# Patient Record
Sex: Female | Born: 1970 | Race: Black or African American | Hispanic: No | Marital: Married | Smoking: Never smoker
Health system: Southern US, Community
[De-identification: ages and names within clinical notes are randomized; demographics above are authoritative.]

## PROBLEM LIST (undated history)

## (undated) DIAGNOSIS — J45909 Unspecified asthma, uncomplicated: Secondary | ICD-10-CM

## (undated) DIAGNOSIS — L509 Urticaria, unspecified: Secondary | ICD-10-CM

## (undated) DIAGNOSIS — G43909 Migraine, unspecified, not intractable, without status migrainosus: Secondary | ICD-10-CM

## (undated) DIAGNOSIS — Z803 Family history of malignant neoplasm of breast: Secondary | ICD-10-CM

## (undated) HISTORY — PX: BREAST CYST ASPIRATION: SHX578

## (undated) HISTORY — DX: Urticaria, unspecified: L50.9

## (undated) HISTORY — DX: Family history of malignant neoplasm of breast: Z80.3

## (undated) HISTORY — DX: Unspecified asthma, uncomplicated: J45.909

## (undated) HISTORY — DX: Migraine, unspecified, not intractable, without status migrainosus: G43.909

---

## 2004-10-13 ENCOUNTER — Other Ambulatory Visit: Admission: RE | Admit: 2004-10-13 | Discharge: 2004-10-13 | Payer: Self-pay | Admitting: Obstetrics and Gynecology

## 2004-10-24 ENCOUNTER — Ambulatory Visit (HOSPITAL_COMMUNITY): Admission: RE | Admit: 2004-10-24 | Discharge: 2004-10-24 | Payer: Self-pay | Admitting: Obstetrics and Gynecology

## 2005-01-05 ENCOUNTER — Encounter: Admission: RE | Admit: 2005-01-05 | Discharge: 2005-01-05 | Payer: Self-pay | Admitting: Internal Medicine

## 2005-11-17 ENCOUNTER — Emergency Department: Payer: Self-pay | Admitting: Emergency Medicine

## 2006-08-16 ENCOUNTER — Ambulatory Visit: Payer: Self-pay | Admitting: Internal Medicine

## 2007-10-09 ENCOUNTER — Ambulatory Visit: Payer: Self-pay | Admitting: Internal Medicine

## 2010-01-25 ENCOUNTER — Encounter: Admission: RE | Admit: 2010-01-25 | Discharge: 2010-04-25 | Payer: Self-pay | Admitting: Obstetrics and Gynecology

## 2010-11-09 ENCOUNTER — Ambulatory Visit: Payer: Self-pay

## 2011-02-03 ENCOUNTER — Ambulatory Visit: Payer: Self-pay | Admitting: Internal Medicine

## 2011-03-01 ENCOUNTER — Ambulatory Visit: Payer: Self-pay | Admitting: Internal Medicine

## 2011-03-05 ENCOUNTER — Ambulatory Visit: Payer: Self-pay | Admitting: Internal Medicine

## 2011-04-05 ENCOUNTER — Ambulatory Visit: Payer: Self-pay

## 2011-04-05 ENCOUNTER — Ambulatory Visit: Payer: Self-pay | Admitting: Internal Medicine

## 2011-11-14 ENCOUNTER — Ambulatory Visit: Payer: Self-pay

## 2012-11-26 ENCOUNTER — Ambulatory Visit: Payer: Self-pay | Admitting: Medical

## 2012-11-28 ENCOUNTER — Ambulatory Visit: Payer: Self-pay | Admitting: Medical

## 2013-12-10 ENCOUNTER — Ambulatory Visit: Payer: Self-pay | Admitting: Family Medicine

## 2015-12-07 ENCOUNTER — Other Ambulatory Visit: Payer: Self-pay | Admitting: Medical

## 2015-12-07 DIAGNOSIS — Z1231 Encounter for screening mammogram for malignant neoplasm of breast: Secondary | ICD-10-CM

## 2015-12-09 ENCOUNTER — Ambulatory Visit
Admission: RE | Admit: 2015-12-09 | Discharge: 2015-12-09 | Disposition: A | Discharge status: Home / Self Care | Payer: BLUE CROSS/BLUE SHIELD | Source: Ambulatory Visit | Attending: Medical | Admitting: Medical

## 2015-12-09 DIAGNOSIS — Z1231 Encounter for screening mammogram for malignant neoplasm of breast: Secondary | ICD-10-CM | POA: Diagnosis present

## 2017-02-27 ENCOUNTER — Ambulatory Visit: Payer: Self-pay | Admitting: Obstetrics & Gynecology

## 2017-03-27 ENCOUNTER — Ambulatory Visit (INDEPENDENT_AMBULATORY_CARE_PROVIDER_SITE_OTHER): Payer: BLUE CROSS/BLUE SHIELD | Admitting: Pediatrics

## 2017-03-27 ENCOUNTER — Encounter: Payer: Self-pay | Admitting: Pediatrics

## 2017-03-27 VITALS — BP 96/70 | HR 80 | Temp 98.2°F | Resp 16 | Ht 64.76 in | Wt 162.0 lb

## 2017-03-27 DIAGNOSIS — L503 Dermatographic urticaria: Secondary | ICD-10-CM | POA: Diagnosis not present

## 2017-03-27 DIAGNOSIS — J4520 Mild intermittent asthma, uncomplicated: Secondary | ICD-10-CM | POA: Diagnosis not present

## 2017-03-27 DIAGNOSIS — J3089 Other allergic rhinitis: Secondary | ICD-10-CM | POA: Diagnosis not present

## 2017-03-27 MED ORDER — FLUTICASONE PROPIONATE 50 MCG/ACT NA SUSP
NASAL | 5 refills | Status: DC
Start: 2017-03-27 — End: 2020-10-13

## 2017-03-27 NOTE — Patient Instructions (Addendum)
Environmental control of dust mite Zyrtec 10 mg once a day for runny nose or itching. You may use one tablet twice a day if the itching is not well controlled Fluticasone 2 sprays per nostril at night for stuffy nose Opcon-A one drop 3 times a day if needed for itchy eyes Do foods with salicylates make you itch? Add prednisone 20 mg twice a day for 3 days, 20 mg on the fourth day,, 10 mg on the fifth  day to bring your allergic symptoms under control Pro-air 2 puffs every 4 hours if needed for wheezing or coughing spells

## 2017-03-27 NOTE — Progress Notes (Signed)
[place] [address] Dept: [phone]  New Patient Note  Patient ID: Maureen Franklin, female    DOB: 1970-11-03  Age: 45 y.o. MRN: [id number] Date of Office Visit: 03/27/2017 Referring provider: Marjory Sneddon, MD [address] [place], [place] [address]    Chief Complaint: Allergic Rhinitis  (all year round. itchy eyes and throat); Nasal Congestion (post nasal drip); and Rash (little fine bumps that makes her itch then goes away. )  HPI Maureen Franklin presents for evaluation of perennial allergic rhinitis for about 13 years. She complains of a runny nose, stuffy nose, itchy eyes, itchy throat . During the past 2 months she has had an itchy rash that comes and goes. She has not had swelling of her throat. A few months ago she was diagnosed with allergy induced asthma and was given an inhaler She has aggravation of her symptoms on exposure to dust. She is lactose intolerant. She has never had eczema or chronic urticaria  Review of Systems  Constitutional: Negative.   HENT:       Perennial nasal congestion for 13 years  Eyes:       Itchy eyes at times  Respiratory:       Allergy induced asthma diagnosed a few months ago  Cardiovascular: Negative.   Gastrointestinal:       Lactose intolerance  Genitourinary: Negative.   Musculoskeletal: Negative.   Skin:       Itchy rash that comes and goes for about 2 months  Neurological: Negative.   Endo/Heme/Allergies:       No diabetes or thyroid disease  Psychiatric/Behavioral: Negative.     Outpatient Encounter Prescriptions as of 03/27/2017  Medication Sig  . cyclobenzaprine (FLEXERIL) 5 MG tablet Take by mouth.  . dicyclomine (BENTYL) 20 MG tablet Take by mouth.  . loratadine (CLARITIN) 10 MG tablet Take by mouth.  . montelukast (SINGULAIR) 10 MG tablet TAKE 1 TABLET(10 MG) BY MOUTH AT BEDTIME  . rizatriptan (MAXALT) 10 MG tablet Take 10 mg by mouth as needed for migraine. May repeat in 2 hours if needed  .  [DISCONTINUED] albuterol (PROVENTIL HFA;VENTOLIN HFA) 108 (90 Base) MCG/ACT inhaler Inhale into the lungs.  . [DISCONTINUED] rizatriptan (MAXALT) 10 MG tablet Take by mouth.  . fluticasone (FLONASE) 50 MCG/ACT nasal spray 2 sprays per nostril at night for stuffy nose.   No facility-administered encounter medications on file as of 03/27/2017.      Drug Allergies:  Allergies  Allergen Reactions  . Lac Bovis     Family History: Maureen Franklin's family history includes Allergic rhinitis in her sister; Breast cancer (age of onset: 92) in her mother..Chronic bronchitis in her mother. Family history is negative for asthma, hayfever, angioedema, eczema, hives, food allergies, emphysema   Social and environmental. There are no pets in the home. She is not exposed to cigarette smoking. She has never smoked cigarettes. 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 She appears well-developed and well-nourished.  HENT:  Eyes normal. Ears normal. Nose mild swelling of nasal  turbinates.  Pharynx normal.  Neck: Neck supple. No thyromegaly present.  Cardiovascular:  S1 and S2 normal no murmurs  Pulmonary/Chest:  Clear to percussion and auscultation  Abdominal: Soft. There is no tenderness (no hepatosplenomegaly).  Lymphadenopathy:    She has no cervical adenopathy.  Neurological: She is alert and oriented to person, place, and time.  Skin:  Dermographia  Psychiatric: She has a normal mood and affect. Her behavior is normal. Judgment and thought content normal.  Vitals reviewed.   Diagnostics: FVC 3.05 L FEV1 2.45 L. Predicted FVC 3.07 L predicted FEV1 2.48 L. After  albuterol 2 puffs FVC 3.06 L FEV1 2.58 L-the spirometry is in the normal range and there was no significant improvement after albuterol  Allergy skin tests were positive to ragweed, some tree pollens, dust mite. Skin testing to foods was negative   Assessment  Assessment and Plan: 1. Other allergic rhinitis   2. Mild intermittent reactive airway disease without complication   3. Dermographia     Meds ordered this encounter  Medications  . fluticasone (FLONASE) 50 MCG/ACT nasal spray    Sig: 2 sprays per nostril at night for stuffy nose.    Dispense:  16 g    Refill:  5    Patient Instructions  Environmental control of dust mite Zyrtec 10 mg once a day for runny nose or itching. You may use one tablet twice a day if the itching is not well controlled Fluticasone 2 sprays per nostril at night for stuffy nose Opcon-A one drop 3 times a day if needed for itchy eyes Do foods with salicylates make you itch? Add prednisone 20 mg twice a day for 3 days, 20 mg on the fourth day,, 10 mg on the fifth  day to bring your allergic symptoms under control Pro-air 2 puffs every 4 hours if needed for wheezing or coughing spells   Return in about 6 weeks (around 05/08/2017).   Thank you for the opportunity to care for this patient.  Please do not hesitate to contact me with questions.  Penne Lash, M.D.  [facility] [address] [place], [place] [address] [phone]

## 2017-03-28 DIAGNOSIS — J45909 Unspecified asthma, uncomplicated: Secondary | ICD-10-CM | POA: Insufficient documentation

## 2017-03-28 DIAGNOSIS — L503 Dermatographic urticaria: Secondary | ICD-10-CM | POA: Insufficient documentation

## 2017-03-28 DIAGNOSIS — J3089 Other allergic rhinitis: Secondary | ICD-10-CM | POA: Insufficient documentation

## 2017-04-13 ENCOUNTER — Ambulatory Visit (INDEPENDENT_AMBULATORY_CARE_PROVIDER_SITE_OTHER): Payer: BLUE CROSS/BLUE SHIELD | Admitting: Obstetrics & Gynecology

## 2017-04-13 ENCOUNTER — Encounter: Payer: Self-pay | Admitting: Obstetrics & Gynecology

## 2017-04-13 VITALS — BP 110/64 | HR 55 | Ht 64.0 in | Wt 163.0 lb

## 2017-04-13 DIAGNOSIS — Z1239 Encounter for other screening for malignant neoplasm of breast: Secondary | ICD-10-CM

## 2017-04-13 DIAGNOSIS — Z1211 Encounter for screening for malignant neoplasm of colon: Secondary | ICD-10-CM

## 2017-04-13 DIAGNOSIS — Z803 Family history of malignant neoplasm of breast: Secondary | ICD-10-CM | POA: Diagnosis not present

## 2017-04-13 DIAGNOSIS — G43829 Menstrual migraine, not intractable, without status migrainosus: Secondary | ICD-10-CM | POA: Diagnosis not present

## 2017-04-13 DIAGNOSIS — Z0000 Encounter for general adult medical examination without abnormal findings: Secondary | ICD-10-CM

## 2017-04-13 DIAGNOSIS — Z1231 Encounter for screening mammogram for malignant neoplasm of breast: Secondary | ICD-10-CM | POA: Diagnosis not present

## 2017-04-13 DIAGNOSIS — Z01419 Encounter for gynecological examination (general) (routine) without abnormal findings: Secondary | ICD-10-CM | POA: Diagnosis not present

## 2017-04-13 NOTE — Progress Notes (Signed)
HPI:      [name] is a 46 y.o. G3P0030 who LMP was Patient's last menstrual period was 04/04/2017., she presents today for her annual examination. The patient has no complaints today. The patient is sexually active. Her last pap: approximate date 2016 and was normal and last mammogram: approximate date 2016 and was normal. The patient does perform self breast exams.  There is notable family history of breast or ovarian cancer in her family.  The patient has regular exercise: yes.  The patient denies current symptoms of depression.    GYN History: Contraception: none  PMHx: Past Medical History:  Diagnosis Date  . Family history of breast cancer   . Migraine    Past Surgical History:  Procedure Laterality Date  . BREAST CYST ASPIRATION Right    neg   Family History  Problem Relation Age of Onset  . Breast cancer Mother 53  . Prostate cancer Father 93  . Allergic rhinitis Sister   . Colon cancer Maternal Uncle 80  . Angioedema Neg Hx   . Asthma Neg Hx   . Eczema Neg Hx   . Immunodeficiency Neg Hx   . Urticaria Neg Hx    Social History  Substance Use Topics  . Smoking status: Never Smoker  . Smokeless tobacco: Never Used  . Alcohol use No    Current Outpatient Prescriptions:  .  cyclobenzaprine (FLEXERIL) 5 MG tablet, Take by mouth., Disp: , Rfl:  .  dicyclomine (BENTYL) 20 MG tablet, Take by mouth., Disp: , Rfl:  .  fluticasone (FLONASE) 50 MCG/ACT nasal spray, 2 sprays per nostril at night for stuffy nose., Disp: 16 g, Rfl: 5 .  loratadine (CLARITIN) 10 MG tablet, Take by mouth., Disp: , Rfl:  .  montelukast (SINGULAIR) 10 MG tablet, TAKE 1 TABLET(10 MG) BY MOUTH AT BEDTIME, Disp: , Rfl:  .  rizatriptan (MAXALT) 10 MG tablet, Take 10 mg by mouth as needed for migraine. May repeat in 2 hours if needed, Disp: , Rfl:  Allergies: Lac bovis  ROS  Objective: BP 110/64   Pulse (!) 55   Ht 5' 4"  (1.626 m)   Wt 163 lb (73.9 kg)   LMP 04/04/2017   BMI 27.98  kg/m   Filed Weights   04/13/17 0806  Weight: 163 lb (73.9 kg)   Body mass index is 27.98 kg/m. OBGyn Exam  Assessment:  ANNUAL EXAM 1. Annual physical exam   2. Screen for colon cancer   3. Screening for breast cancer   4. Family history of breast cancer   5. Menstrual migraine without status migrainosus, not intractable      Screening Plan:            1.  Cervical Screening-  Pap smear schedule reviewed with patient, due 2019  2. Breast screening- Exam annually and mammogram>40 planned   3. Colonoscopy every 10 years, Hemoccult testing - after age 27  4. Labs managed by PCP  5. Counseling for contraception: no method  Other:  1. Annual physical exam  2. Screen for colon cancer - Ambulatory referral to Gastroenterology  3. Screening for breast cancer - MM DIGITAL SCREENING BILATERAL; Future  4. Family history of breast cancer -  She presents with a significant personal and/or family history of breast cancer mother onset 74 yo. Details of which can be found in her medical/family history. She does not have a previously identified BRCA and Lynch syndrome mutation in her family. Due  to her personal and/or family history of cancer she is a candidate for the [facility] test(s).    Risk for cancer, genetic susceptibility discussed.  Patient has undecided gene testing.  Discussed BRCA as well as Lynch syndrome and other cancer risk assessments available based on her family history and personal history. Pros and cons of testing discussed. To consider. - MM DIGITAL SCREENING BILATERAL; Future  5. Menstrual migraine without status migrainosus, not intractable Consider progesterone management of hormones as she transitions to menopause to help minimize menstrual migraines.  No estrogen so as not to increase DVT risk.  She will consider.    F/U  Return in about 1 year (around 04/13/2018) for Annual.  Barnett Applebaum, MD, [facility], [facility] 04/13/2017  8:43  AM

## 2017-04-13 NOTE — Patient Instructions (Addendum)
PAP every three years Mammogram every year    Call [phone] to schedule at [facility] yearly (with PCP)  BRCA Gene Testing Why am I having this test? BRCA gene testing is done to check for the presence of harmful changes (mutations) in the BRCA1 gene or the BRCA2 gene (breast cancer susceptibility genes). If there is a mutation, the genes may not be able to help repair damaged cells in the body. As a result, the damaged cells may develop defects that can lead to certain types of cancer. You may have this test if you have a family history of certain types of cancer, including cancer of the:  Breast.  Ovaries.  Fallopian tubes.  Peritoneum.  Pancreas.  Prostate.  What kind of sample is taken? The test requires either a sample of blood or a sample of cells from your saliva. If a sample of blood is needed, it will probably be collected by inserting a needle into a vein. If a sample of saliva is needed, you will get instructions about how to collect the sample. What do the results mean? The test results can show whether you have a mutation in the BRCA1 or BRCA2 gene that increases your risk for certain cancers. Meaning of negative test results A negative test result means that you do not have a mutation in the BRCA1 or BRCA2 gene that is known to increase your risk for certain cancers. This does not mean that you will never get cancer. Talk with your health care provider or a genetic counselor about what this result means for you. Meaning of positive test results A positive test result means that you have a mutation in the BRCA1 or BRCA2 gene that increases your risk for certain cancers. Women with a positive test result have an increased risk for breast and ovarian cancer. Both women and men with a mutation have an increased risk for breast cancer and may be at greater risk for other types of cancer. Getting a positive test result does not mean that you will develop cancer. Talk with  your health care provider or a genetic counselor about what this result means for you. You may be told that you are a carrier. This means that you can pass the mutation to your children. Meaning of ambiguous test results Ambiguous, inconclusive, or uncertain test results mean that there is a change in the BRCA1 or BRCA2 gene, but it is a change that has not been linked to cancer. Talk with your health care provider or a genetic counselor about what this result means for you. Talk with your health care provider to discuss your results, treatment options, and if necessary, the need for more tests. Talk with your health care provider if you have any questions about your results. How do I get my results? It is up to you to get your test results. Ask your health care provider, or the department that is doing the test, when your results will be ready. This information is not intended to replace advice given to you by your health care provider. Make sure you discuss any questions you have with your health care provider. Document Released: 09/14/2004 Document Revised: 04/24/2016 Document Reviewed: 04/12/2016 Elsevier Interactive Patient Education  [employer].

## 2017-05-08 ENCOUNTER — Encounter: Payer: Self-pay | Admitting: Pediatrics

## 2017-05-08 ENCOUNTER — Ambulatory Visit (INDEPENDENT_AMBULATORY_CARE_PROVIDER_SITE_OTHER): Payer: BLUE CROSS/BLUE SHIELD | Admitting: Pediatrics

## 2017-05-08 VITALS — BP 106/68 | HR 86 | Temp 98.4°F | Resp 16

## 2017-05-08 DIAGNOSIS — L503 Dermatographic urticaria: Secondary | ICD-10-CM | POA: Diagnosis not present

## 2017-05-08 DIAGNOSIS — J3089 Other allergic rhinitis: Secondary | ICD-10-CM | POA: Diagnosis not present

## 2017-05-08 DIAGNOSIS — J4520 Mild intermittent asthma, uncomplicated: Secondary | ICD-10-CM | POA: Diagnosis not present

## 2017-05-08 NOTE — Progress Notes (Signed)
  Maureen Franklin Dept: [phone]  FOLLOW UP NOTE  Patient ID: Maureen Franklin, female    DOB: [birth year]  Age: 46 y.o. MRN: [id number] Date of Office Visit: 05/08/2017  Assessment  Chief Complaint: Asthma  HPI Maureen Franklin presents for follow-up of urticaria with  dermographia, allergic rhinitis and reactive airways. She has been taking cetirizine 10 mg once a day and about once a week she does have additional itching. She has itching if she gets overheated Otherwise her on urticaria is well controlled. Her nasal symptoms are well controlled. She has not had to use Pro-air. She had a normal physical exam with lab work  this year. Foods with salicylates did not make her itch.   Current medications are outlined in the chart   Drug Allergies:  Allergies  Allergen Reactions  . Lac Bovis     Physical Exam: BP 106/68   Pulse 86   Temp 98.4 F (36.9 C) (Oral)   Resp 16   SpO2 98%    Physical Exam  Constitutional: She is oriented to person, place, and time. She appears well-developed and well-nourished.  HENT:  Eyes normal. Ears normal. Nose normal. Pharynx normal.  Neck: Neck supple. No thyromegaly present.  Cardiovascular:  S1 and S2 normal no murmurs  Pulmonary/Chest:  Clear to percussion and auscultation  Lymphadenopathy:    She has no cervical adenopathy.  Neurological: She is alert and oriented to person, place, and time.  Skin:  Clear  Psychiatric: She has a normal mood and affect. Her behavior is normal. Judgment and thought content normal.  Vitals reviewed.   Diagnostics:  FVC 3.12 L FEV1 2.45 L. Predicted FVC 2.98 L predicted FEV1 2.41 L-the spirometry is in the normal range  Assessment and Plan: 1. Mild intermittent reactive airway disease without complication   2. Dermographia   3. Other allergic rhinitis        Patient Instructions  Cetirizine 10 mg once a day. She may take cetirizine 10 mg twice a day if she is having  any itching Fluticasone 2 sprays per nostril once a day if needed for stuffy nose Opcon-A one drop 3 times a day if needed for itchy eyes Pro-air 2 puffs every 4 hours if needed for wheezing or coughing spells Call me if she is not doing well on this treatment plan   Return in about 1 year (around 05/08/2018).    Thank you for the opportunity to care for this patient.  Please do not hesitate to contact me with questions.  Penne Lash, M.D.  [facility] [address] [place], [place] [address] [phone]

## 2017-05-08 NOTE — Patient Instructions (Signed)
Cetirizine 10 mg once a day. She may take cetirizine 10 mg twice a day if she is having any itching Fluticasone 2 sprays per nostril once a day if needed for stuffy nose Opcon-A one drop 3 times a day if needed for itchy eyes Pro-air 2 puffs every 4 hours if needed for wheezing or coughing spells Call me if she is not doing well on this treatment plan

## 2018-01-15 ENCOUNTER — Ambulatory Visit: Payer: BLUE CROSS/BLUE SHIELD | Admitting: Pediatrics

## 2018-01-29 ENCOUNTER — Encounter: Payer: Self-pay | Admitting: Pediatrics

## 2018-01-29 ENCOUNTER — Ambulatory Visit: Payer: BLUE CROSS/BLUE SHIELD | Admitting: Pediatrics

## 2018-01-29 VITALS — BP 122/68 | HR 82 | Temp 98.5°F | Resp 16 | Ht 64.0 in | Wt 158.3 lb

## 2018-01-29 DIAGNOSIS — J4520 Mild intermittent asthma, uncomplicated: Secondary | ICD-10-CM | POA: Diagnosis not present

## 2018-01-29 DIAGNOSIS — J3089 Other allergic rhinitis: Secondary | ICD-10-CM

## 2018-01-29 DIAGNOSIS — L503 Dermatographic urticaria: Secondary | ICD-10-CM

## 2018-01-29 NOTE — Progress Notes (Signed)
  [place] [address] Dept: [phone]  FOLLOW UP NOTE  Patient ID: Maureen Franklin, female    DOB: 1971-02-16  Age: 47 y.o. MRN: [id number] Date of Office Visit: 01/29/2018  Assessment  Chief Complaint: Allergic Rhinitis  (doing good) and Urticaria (doing good)  HPI [name] presents for follow-up of allergic rhinitis, dermographia and reactive airways. Her symptoms are well controlled. She is on cetirizine 10 mg once a day,  fluticasone 2 sprays per nostril once a day if needed. She has not had to use Pro-air recently.  Current medications will be continued and outlined in the after visit summary   Drug Allergies:  Allergies  Allergen Reactions  . Lac Bovis     Physical Exam: BP 122/68 (BP Location: Right Arm, Patient Position: Sitting, Cuff Size: Normal)   Pulse 82   Temp 98.5 F (36.9 C) (Oral)   Resp 16   Ht 5\' 4"  (1.626 m)   Wt 158 lb 4.6 oz (71.8 kg)   SpO2 98%   BMI 27.17 kg/m    Physical Exam  Constitutional: She is oriented to person, place, and time. She appears well-developed and well-nourished.  HENT:  Eyes normal. Ears normal. Nose normal. Pharynx normal.  Neck: Neck supple.  Cardiovascular:  S1 and S2 normal no murmurs  Pulmonary/Chest:  Clear to percussion and auscultation  Lymphadenopathy:    She has no cervical adenopathy.  Neurological: She is alert and oriented to person, place, and time.  Skin:  clear  Psychiatric: She has a normal mood and affect. Her behavior is normal. Judgment and thought content normal.  Vitals reviewed.   Diagnostics:  FVC 2.99 L FEV1 2.40 L. predicted FVC 2.98 L predicted FEV1 2.41 L-the spirometry is in the normal range  Assessment and Plan: 1. Mild intermittent reactive airway disease without complication   2. Dermographia   3. Other allergic rhinitis        Patient Instructions  Zyrtec 10 mg once a day if needed for itching or runny nose. She may increase Zyrtec to 10 mg twice  a day if the  hives are not under control Fluticasone 2 sprays per nostril once a day if needed for stuffy nose Pro-air 2 puffs every 4 hours if needed for wheezing or coughing spells Continue on her other medications Call me if you're not doing well on this treatment plan   Return in about 1 year (around 01/30/2019).    Thank you for the opportunity to care for this patient.  Please do not hesitate to contact me with questions.  Penne Lash, M.D.  [facility] [address] [place], [place] [address] [phone]

## 2018-01-29 NOTE — Patient Instructions (Addendum)
Zyrtec 10 mg once a day if needed for itching or runny nose. She may increase Zyrtec to 10 mg twice a day if the  hives are not under control Fluticasone 2 sprays per nostril once a day if needed for stuffy nose Pro-air 2 puffs every 4 hours if needed for wheezing or coughing spells Continue on her other medications Call me if you're not doing well on this treatment plan

## 2018-04-18 ENCOUNTER — Encounter: Payer: Self-pay | Admitting: Obstetrics & Gynecology

## 2018-04-18 ENCOUNTER — Ambulatory Visit (INDEPENDENT_AMBULATORY_CARE_PROVIDER_SITE_OTHER): Payer: BLUE CROSS/BLUE SHIELD | Admitting: Obstetrics & Gynecology

## 2018-04-18 ENCOUNTER — Other Ambulatory Visit (HOSPITAL_COMMUNITY)
Admission: RE | Admit: 2018-04-18 | Discharge: 2018-04-18 | Disposition: A | Discharge status: Home / Self Care | Payer: BLUE CROSS/BLUE SHIELD | Source: Ambulatory Visit | Attending: Obstetrics & Gynecology | Admitting: Obstetrics & Gynecology

## 2018-04-18 VITALS — BP 110/70 | Ht 64.0 in | Wt 159.0 lb

## 2018-04-18 DIAGNOSIS — Z124 Encounter for screening for malignant neoplasm of cervix: Secondary | ICD-10-CM | POA: Insufficient documentation

## 2018-04-18 DIAGNOSIS — Z1231 Encounter for screening mammogram for malignant neoplasm of breast: Secondary | ICD-10-CM

## 2018-04-18 DIAGNOSIS — Z01419 Encounter for gynecological examination (general) (routine) without abnormal findings: Secondary | ICD-10-CM

## 2018-04-18 DIAGNOSIS — Z0000 Encounter for general adult medical examination without abnormal findings: Secondary | ICD-10-CM

## 2018-04-18 DIAGNOSIS — Z1239 Encounter for other screening for malignant neoplasm of breast: Secondary | ICD-10-CM

## 2018-04-18 NOTE — Progress Notes (Signed)
HPI:      [name] is a 47 y.o. G3P0030 who LMP was Patient's last menstrual period was 04/08/2018., she presents today for her annual examination. The patient has no complaints today.  Periods reg, has one day headache each period. The patient is sexually active. Her last pap: approximate date 2016 and was normal and last mammogram: approximate date 2017 and was normal. The patient does perform self breast exams.  There is notable family history of breast or ovarian cancer in her family. Sister is BRCA neg. The patient has regular exercise: yes.  The patient denies current symptoms of depression.    GYN History: Contraception: none  PMHx: Past Medical History:  Diagnosis Date  . Asthma   . Family history of breast cancer   . Migraine   . Urticaria    Past Surgical History:  Procedure Laterality Date  . BREAST CYST ASPIRATION Right    neg   Family History  Problem Relation Age of Onset  . Breast cancer Mother 50  . Prostate cancer Father 56  . Allergic rhinitis Sister   . Colon cancer Maternal Uncle 61  . Angioedema Neg Hx   . Asthma Neg Hx   . Eczema Neg Hx   . Immunodeficiency Neg Hx   . Urticaria Neg Hx    Social History   Tobacco Use  . Smoking status: Never Smoker  . Smokeless tobacco: Never Used  Substance Use Topics  . Alcohol use: No  . Drug use: No    Current Outpatient Medications:  .  albuterol (PROAIR HFA) 108 (90 Base) MCG/ACT inhaler, Inhale into the lungs every 6 (six) hours as needed for wheezing or shortness of breath., Disp: , Rfl:  .  cyclobenzaprine (FLEXERIL) 5 MG tablet, Take by mouth., Disp: , Rfl:  .  dicyclomine (BENTYL) 20 MG tablet, Take by mouth., Disp: , Rfl:  .  fluticasone (FLONASE) 50 MCG/ACT nasal spray, 2 sprays per nostril at night for stuffy nose., Disp: 16 g, Rfl: 5 .  montelukast (SINGULAIR) 10 MG tablet, TAKE 1 TABLET(10 MG) BY MOUTH AT BEDTIME, Disp: , Rfl:  .  rizatriptan (MAXALT) 10 MG tablet, Take 10 mg by mouth  as needed for migraine. May repeat in 2 hours if needed, Disp: , Rfl:  .  verapamil (CALAN-SR) 240 MG CR tablet, Take by mouth., Disp: , Rfl:  Allergies: Lac bovis  Review of Systems  Constitutional: Negative for chills, fever and malaise/fatigue.  HENT: Negative for congestion, sinus pain and sore throat.   Eyes: Negative for blurred vision and pain.  Respiratory: Negative for cough and wheezing.   Cardiovascular: Negative for chest pain and leg swelling.  Gastrointestinal: Negative for abdominal pain, constipation, diarrhea, heartburn, nausea and vomiting.  Genitourinary: Negative for dysuria, frequency, hematuria and urgency.  Musculoskeletal: Negative for back pain, joint pain, myalgias and neck pain.  Skin: Negative for itching and rash.  Neurological: Negative for dizziness, tremors and weakness.  Endo/Heme/Allergies: Does not bruise/bleed easily.  Psychiatric/Behavioral: Negative for depression. The patient is not nervous/anxious and does not have insomnia.    Objective: BP 110/70   Ht 5' 4" (1.626 m)   Wt 159 lb (72.1 kg)   LMP 04/08/2018   BMI 27.29 kg/m   Filed Weights   04/18/18 0822  Weight: 159 lb (72.1 kg)   Body mass index is 27.29 kg/m. Physical Exam  Constitutional: She is oriented to person, place, and time. She appears well-developed and well-nourished. No  distress.  Genitourinary: Rectum normal, vagina normal and uterus normal. Pelvic exam was performed with patient supine. There is no rash or lesion on the right labia. There is no rash or lesion on the left labia. Vagina exhibits no lesion. No bleeding in the vagina. Right adnexum does not display mass and does not display tenderness. Left adnexum does not display mass and does not display tenderness. Cervix does not exhibit motion tenderness, lesion, friability or polyp.   Uterus is mobile and midaxial. Uterus is not enlarged or exhibiting a mass.  HENT:  Head: Normocephalic and atraumatic. Head is without  laceration.  Right Ear: Hearing normal.  Left Ear: Hearing normal.  Nose: No epistaxis.  No foreign bodies.  Mouth/Throat: Uvula is midline, oropharynx is clear and moist and mucous membranes are normal.  Eyes: Pupils are equal, round, and reactive to light.  Neck: Normal range of motion. Neck supple. No thyromegaly present.  Cardiovascular: Normal rate and regular rhythm. Exam reveals no gallop and no friction rub.  No murmur heard. Pulmonary/Chest: Effort normal and breath sounds normal. No respiratory distress. She has no wheezes. Right breast exhibits no mass, no skin change and no tenderness. Left breast exhibits no mass, no skin change and no tenderness.  Abdominal: Soft. Bowel sounds are normal. She exhibits no distension. There is no tenderness. There is no rebound.  Musculoskeletal: Normal range of motion.  Neurological: She is alert and oriented to person, place, and time. No cranial nerve deficit.  Skin: Skin is warm and dry.  Psychiatric: She has a normal mood and affect. Judgment normal.  Vitals reviewed.  Assessment:  ANNUAL EXAM 1. Annual physical exam   2. Screening for cervical cancer   3. Screening for breast cancer    Screening Plan:            1.  Cervical Screening-  Pap smear done today  2. Breast screening- Exam annually and mammogram>40 planned  Declines BRCA testing.  3. Colonoscopy planned soon; every 5 or 10 years,  4. Labs managed by PCP  5. Counseling for contraception: no method_0  6. Declines Flu shot    F/U  Return in about 1 year (around 04/19/2019) for Annual.  Barnett Applebaum, MD, [facility], [facility] 04/18/2018  8:58 AM

## 2018-04-18 NOTE — Patient Instructions (Signed)
PAP every three years Mammogram every year    Call [phone] to schedule at [facility] Colonoscopy every 10 years Labs yearly (with PCP) 

## 2018-04-22 LAB — CYTOLOGY - PAP
DIAGNOSIS: NEGATIVE
HPV (WINDOPATH): NOT DETECTED

## 2018-05-08 ENCOUNTER — Ambulatory Visit
Admission: RE | Admit: 2018-05-08 | Discharge: 2018-05-08 | Disposition: A | Discharge status: Home / Self Care | Payer: BLUE CROSS/BLUE SHIELD | Source: Ambulatory Visit | Attending: Obstetrics & Gynecology | Admitting: Obstetrics & Gynecology

## 2018-05-08 DIAGNOSIS — Z1239 Encounter for other screening for malignant neoplasm of breast: Secondary | ICD-10-CM

## 2018-05-08 DIAGNOSIS — Z1231 Encounter for screening mammogram for malignant neoplasm of breast: Secondary | ICD-10-CM | POA: Insufficient documentation

## 2018-07-03 ENCOUNTER — Telehealth: Payer: Self-pay

## 2018-07-03 NOTE — Telephone Encounter (Signed)
Pt called triage worried her period is lasting longer than normal. Usually last 4-5 days now its been 7 days , she states its not heavy just spotting and more bleeding with BM, I advised her to just keep a eye on it . Let us know if it doesn't stop with in a week. Please advise if I should let her know anything else

## 2018-07-04 NOTE — Telephone Encounter (Signed)
Correct.  If pattern develops of prolonged or disruptive periods then can discuss options to treat here.  Often as go towards menopause periods will worsen before going away.

## 2019-06-10 ENCOUNTER — Other Ambulatory Visit: Payer: Self-pay | Admitting: Internal Medicine

## 2019-06-10 DIAGNOSIS — Z1231 Encounter for screening mammogram for malignant neoplasm of breast: Secondary | ICD-10-CM

## 2019-06-12 ENCOUNTER — Ambulatory Visit
Admission: RE | Admit: 2019-06-12 | Discharge: 2019-06-12 | Disposition: A | Discharge status: Home / Self Care | Payer: BC Managed Care – PPO | Source: Ambulatory Visit | Attending: Internal Medicine | Admitting: Internal Medicine

## 2019-06-12 DIAGNOSIS — Z1231 Encounter for screening mammogram for malignant neoplasm of breast: Secondary | ICD-10-CM | POA: Diagnosis present

## 2019-06-13 ENCOUNTER — Other Ambulatory Visit: Payer: Self-pay | Admitting: Internal Medicine

## 2019-06-13 DIAGNOSIS — N6489 Other specified disorders of breast: Secondary | ICD-10-CM

## 2019-06-13 DIAGNOSIS — N6310 Unspecified lump in the right breast, unspecified quadrant: Secondary | ICD-10-CM

## 2019-06-13 DIAGNOSIS — R928 Other abnormal and inconclusive findings on diagnostic imaging of breast: Secondary | ICD-10-CM

## 2019-06-16 ENCOUNTER — Telehealth: Payer: Self-pay

## 2019-06-16 NOTE — Telephone Encounter (Signed)
Pt calling; had mammogram at [facility]; per MyChart there are concerns; pt tried to sched her appt and was told [facility] would need to approve her making appt.  [phone]  Pt aware PH is not in office today but will be back in office tomorrow.  Adv she will either hear form [place] or [facility].  Pt can be reached at this same number tomorrow.

## 2019-06-17 NOTE — Telephone Encounter (Signed)
D/w pt, aware PCP ordered MMG and report initially went to Dr Judi Cong.  She will follow up for additional imagry of right breast soon. Also due for annual exam here.  Maureen Franklin, please call and schedule  annual exam w [facility].

## 2019-06-17 NOTE — Telephone Encounter (Signed)
Patient is schedule for 07/11/19 with [facility]

## 2019-06-25 ENCOUNTER — Ambulatory Visit: Payer: Self-pay

## 2019-06-25 ENCOUNTER — Other Ambulatory Visit: Payer: Self-pay

## 2019-06-25 DIAGNOSIS — Z23 Encounter for immunization: Secondary | ICD-10-CM

## 2019-06-27 ENCOUNTER — Other Ambulatory Visit: Payer: Self-pay

## 2019-06-27 ENCOUNTER — Ambulatory Visit
Admission: RE | Admit: 2019-06-27 | Discharge: 2019-06-27 | Disposition: A | Discharge status: Home / Self Care | Payer: BC Managed Care – PPO | Source: Ambulatory Visit | Attending: Internal Medicine | Admitting: Internal Medicine

## 2019-06-27 DIAGNOSIS — R928 Other abnormal and inconclusive findings on diagnostic imaging of breast: Secondary | ICD-10-CM | POA: Insufficient documentation

## 2019-06-27 DIAGNOSIS — N6310 Unspecified lump in the right breast, unspecified quadrant: Secondary | ICD-10-CM

## 2019-06-27 DIAGNOSIS — N6489 Other specified disorders of breast: Secondary | ICD-10-CM

## 2019-07-11 ENCOUNTER — Other Ambulatory Visit: Payer: Self-pay

## 2019-07-11 ENCOUNTER — Encounter: Payer: Self-pay | Admitting: Obstetrics & Gynecology

## 2019-07-11 ENCOUNTER — Ambulatory Visit (INDEPENDENT_AMBULATORY_CARE_PROVIDER_SITE_OTHER): Payer: BC Managed Care – PPO | Admitting: Obstetrics & Gynecology

## 2019-07-11 VITALS — BP 120/80 | Ht 64.0 in | Wt 158.0 lb

## 2019-07-11 DIAGNOSIS — Z01419 Encounter for gynecological examination (general) (routine) without abnormal findings: Secondary | ICD-10-CM

## 2019-07-11 NOTE — Progress Notes (Signed)
HPI:      [name] is a 48 y.o. G3P0030 who LMP was Patient's last menstrual period was 06/20/2019., she presents today for her annual examination. The patient has no complaints today. Reg cycles. The patient is sexually active. Her last pap: approximate date 2019 and was normal and last mammogram: approximate date 2020 and was normal. The patient does perform self breast exams.  There is notable family history of breast or ovarian cancer in her family. Sister BRCA Neg.  Mother w breast cancer.  The patient has regular exercise: yes.  The patient denies current symptoms of depression.    GYN History: Contraception: none  PMHx: Past Medical History:  Diagnosis Date  . Asthma   . Family history of breast cancer   . Migraine   . Urticaria    Past Surgical History:  Procedure Laterality Date  . BREAST CYST ASPIRATION Right    neg   Family History  Problem Relation Age of Onset  . Breast cancer Mother 49  . Prostate cancer Father 39  . Allergic rhinitis Sister   . Colon cancer Maternal Uncle 29  . Angioedema Neg Hx   . Asthma Neg Hx   . Eczema Neg Hx   . Immunodeficiency Neg Hx   . Urticaria Neg Hx    Social History   Tobacco Use  . Smoking status: Never Smoker  . Smokeless tobacco: Never Used  Substance Use Topics  . Alcohol use: No  . Drug use: No    Current Outpatient Medications:  .  albuterol (PROAIR HFA) 108 (90 Base) MCG/ACT inhaler, Inhale into the lungs every 6 (six) hours as needed for wheezing or shortness of breath., Disp: , Rfl:  .  cyclobenzaprine (FLEXERIL) 5 MG tablet, Take by mouth., Disp: , Rfl:  .  dicyclomine (BENTYL) 20 MG tablet, Take by mouth., Disp: , Rfl:  .  fluticasone (FLONASE) 50 MCG/ACT nasal spray, 2 sprays per nostril at night for stuffy nose., Disp: 16 g, Rfl: 5 .  montelukast (SINGULAIR) 10 MG tablet, TAKE 1 TABLET(10 MG) BY MOUTH AT BEDTIME, Disp: , Rfl:  .  rizatriptan (MAXALT) 10 MG tablet, Take 10 mg by mouth as needed for  migraine. May repeat in 2 hours if needed, Disp: , Rfl:  .  verapamil (CALAN-SR) 240 MG CR tablet, Take by mouth., Disp: , Rfl:  Allergies: Lac bovis  Review of Systems  Constitutional: Negative for chills, fever and malaise/fatigue.  HENT: Negative for congestion, sinus pain and sore throat.   Eyes: Negative for blurred vision and pain.  Respiratory: Negative for cough and wheezing.   Cardiovascular: Negative for chest pain and leg swelling.  Gastrointestinal: Negative for abdominal pain, constipation, diarrhea, heartburn, nausea and vomiting.  Genitourinary: Negative for dysuria, frequency, hematuria and urgency.  Musculoskeletal: Negative for back pain, joint pain, myalgias and neck pain.  Skin: Negative for itching and rash.  Neurological: Negative for dizziness, tremors and weakness.  Endo/Heme/Allergies: Does not bruise/bleed easily.  Psychiatric/Behavioral: Negative for depression. The patient is not nervous/anxious and does not have insomnia.     Objective: BP 120/80   Ht 5' 4"  (1.626 m)   Wt 158 lb (71.7 kg)   LMP 06/20/2019   BMI 27.12 kg/m   Filed Weights   07/11/19 0950  Weight: 158 lb (71.7 kg)   Body mass index is 27.12 kg/m. Physical Exam Constitutional:      General: She is not in acute distress.    Appearance:  She is well-developed.  Genitourinary:     Pelvic exam was performed with patient supine.     Vagina, uterus and rectum normal.     No lesions in the vagina.     No vaginal bleeding.     No cervical motion tenderness, friability, lesion or polyp.     Uterus is mobile.     Uterus is not enlarged.     No uterine mass detected.    Uterus is midaxial.     No right or left adnexal mass present.     Right adnexa not tender.     Left adnexa not tender.  HENT:     Head: Normocephalic and atraumatic. No laceration.     Right Ear: Hearing normal.     Left Ear: Hearing normal.     Mouth/Throat:     Pharynx: Uvula midline.  Eyes:     Pupils: Pupils  are equal, round, and reactive to light.  Neck:     Musculoskeletal: Normal range of motion and neck supple.     Thyroid: No thyromegaly.  Cardiovascular:     Rate and Rhythm: Normal rate and regular rhythm.     Heart sounds: No murmur. No friction rub. No gallop.   Pulmonary:     Effort: Pulmonary effort is normal. No respiratory distress.     Breath sounds: Normal breath sounds. No wheezing.  Chest:     Breasts:        Right: No mass, skin change or tenderness.        Left: No mass, skin change or tenderness.  Abdominal:     General: Bowel sounds are normal. There is no distension.     Palpations: Abdomen is soft.     Tenderness: There is no abdominal tenderness. There is no rebound.  Musculoskeletal: Normal range of motion.  Neurological:     Mental Status: She is alert and oriented to person, place, and time.     Cranial Nerves: No cranial nerve deficit.  Skin:    General: Skin is warm and dry.  Psychiatric:        Judgment: Judgment normal.  Vitals signs reviewed.     Assessment:  ANNUAL EXAM 1. Women's annual routine gynecological examination      Screening Plan:            1.  Cervical Screening-  Pap smear schedule reviewed with patient  2. Breast screening- Exam annually and mammogram>40 planned   3. Colonoscopy every 5 years per GI, last one 2019, Hemoccult testing - after age 85  4. Labs managed by PCP  5. Counseling for contraception: no method    F/U  Return in about 1 year (around 07/10/2020) for Annual.  Barnett Applebaum, MD, [facility], [facility] 07/11/2019  10:23 AM

## 2019-07-11 NOTE — Patient Instructions (Signed)
PAP every three years Mammogram every year Colonoscopy every 5 years Labs yearly (with PCP)   

## 2019-07-24 ENCOUNTER — Encounter: Payer: Self-pay | Admitting: Obstetrics and Gynecology

## 2019-11-13 ENCOUNTER — Other Ambulatory Visit: Payer: Self-pay

## 2019-11-13 ENCOUNTER — Ambulatory Visit: Payer: Self-pay | Attending: Internal Medicine

## 2019-11-13 DIAGNOSIS — Z23 Encounter for immunization: Secondary | ICD-10-CM

## 2019-11-13 NOTE — Progress Notes (Signed)
   Covid-19 Vaccination Clinic  Name:  Maureen Franklin    MRN: [id number] DOB: 1971/08/29  11/13/2019  Ms. Brazelton was observed post Covid-19 immunization for 15 minutes without incident. She was provided with Vaccine Information Sheet and instruction to access the V-Safe system.   Ms. Steff was instructed to call 911 with any severe reactions post vaccine: Marland Kitchen Difficulty breathing  . Swelling of face and throat  . A fast heartbeat  . A bad rash all over body  . Dizziness and weakness   Immunizations Administered    Name Date Dose VIS Date Route   Pfizer COVID-19 Vaccine 11/13/2019  9:36 AM 0.3 mL 08/15/2019 Intramuscular   Manufacturer: [place]   Lot: [id number]   [place]: [id number]

## 2019-12-06 IMAGING — MG MM DIGITAL SCREENING BILAT W/ TOMO W/ CAD
8 series · 8 of 24 positions shown · non-contrast
Comparison: Previous exam(s).

CLINICAL DATA: Screening.

EXAM:
DIGITAL SCREENING BILATERAL MAMMOGRAM WITH TOMO AND CAD

[L CC synth-2D]
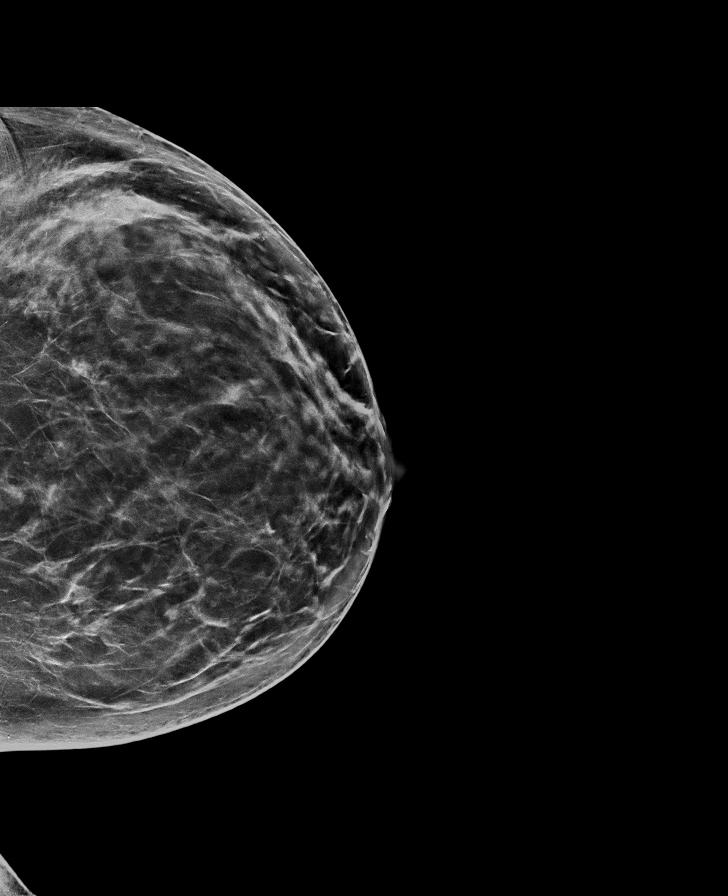

[L MLO synth-2D]
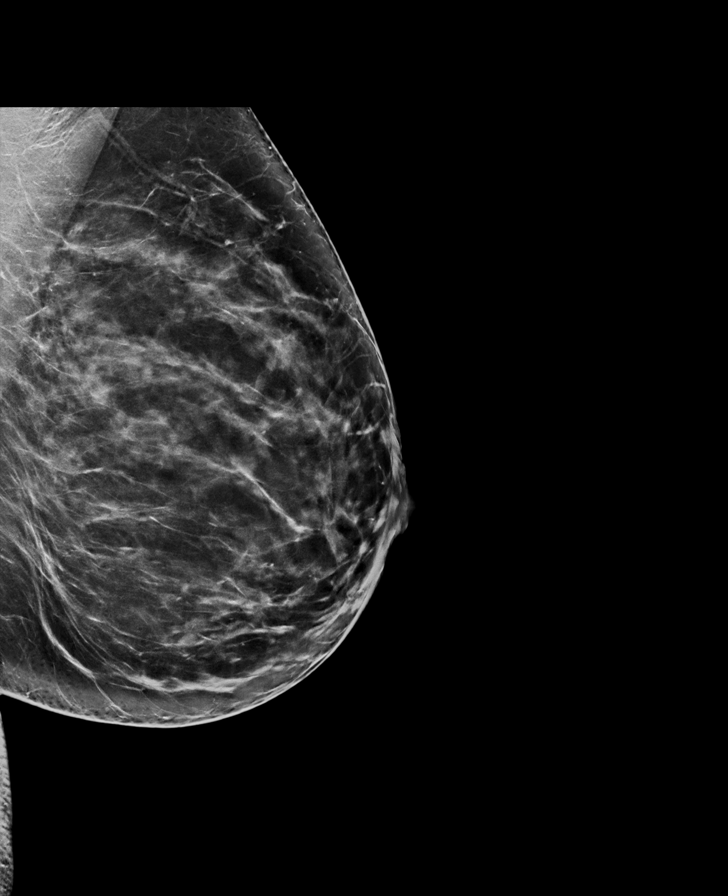

[R CC synth-2D]
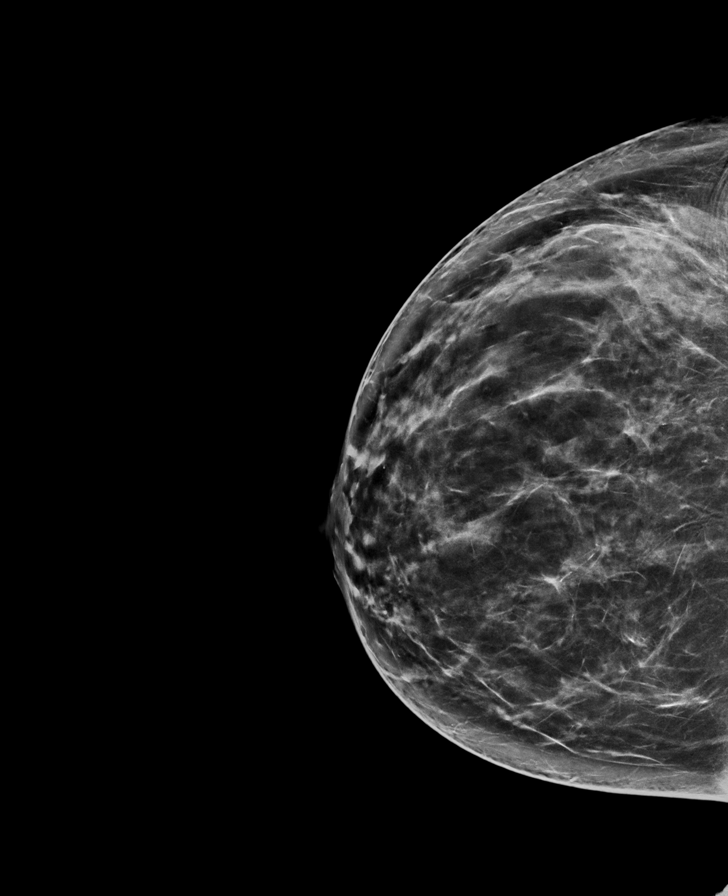

[R MLO synth-2D]
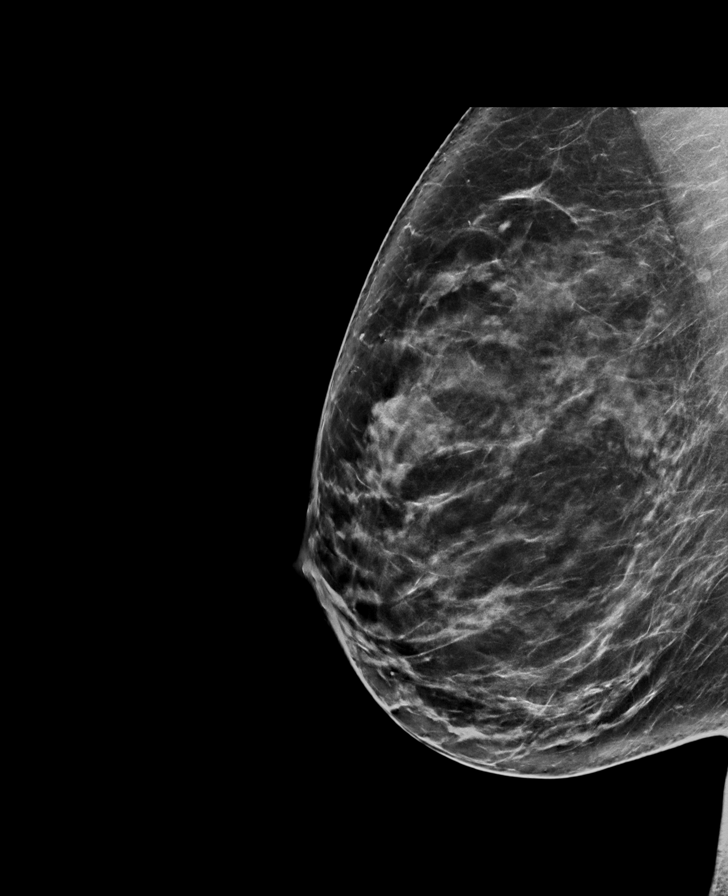

[R CC tomo · tomo slice 46/91.0]
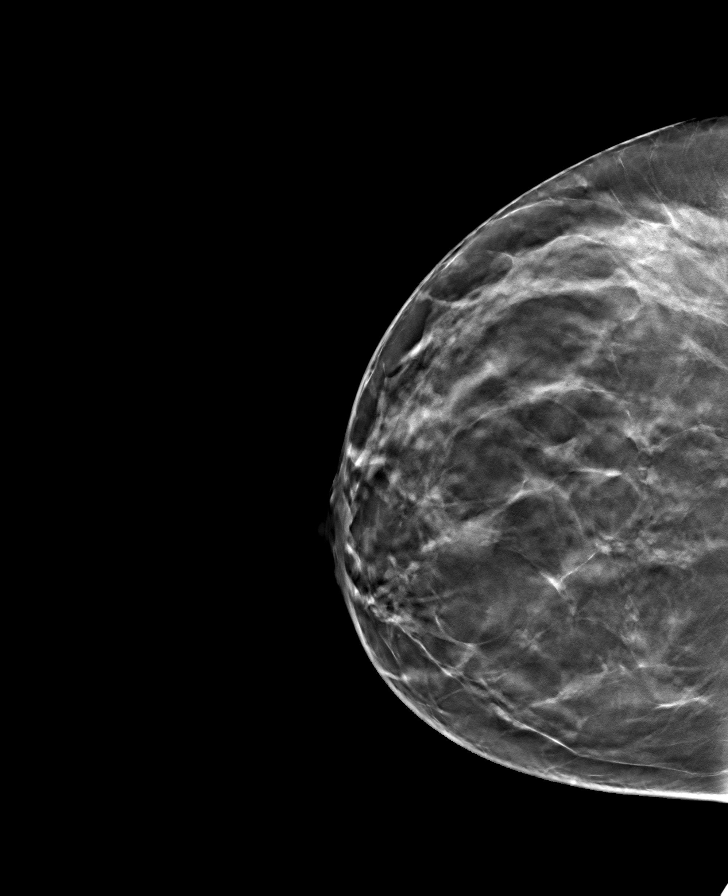

[L CC tomo · tomo slice 44/87.0]
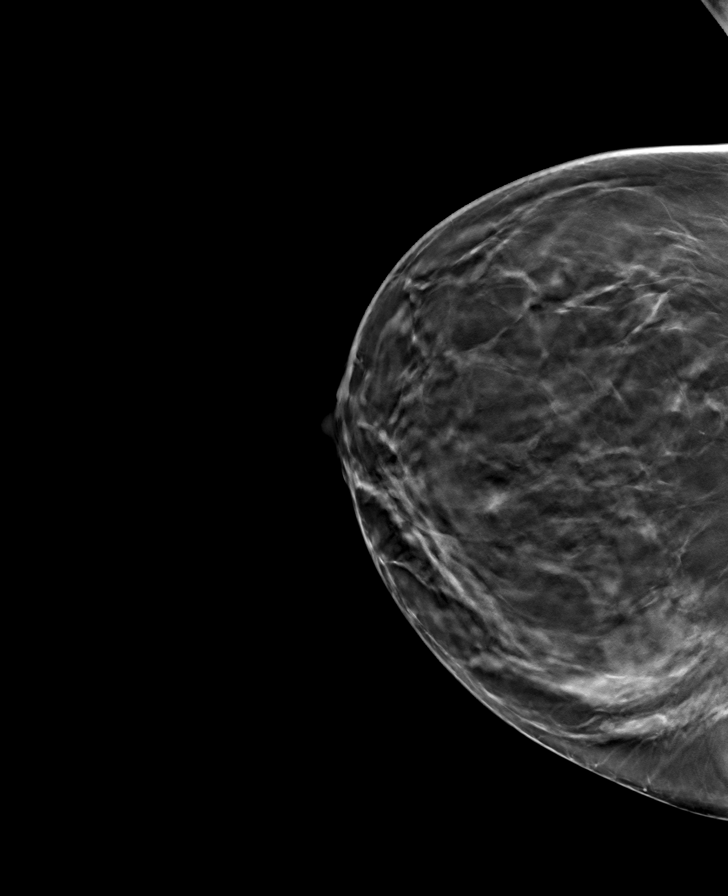

[L MLO tomo · tomo slice 45/90.0]
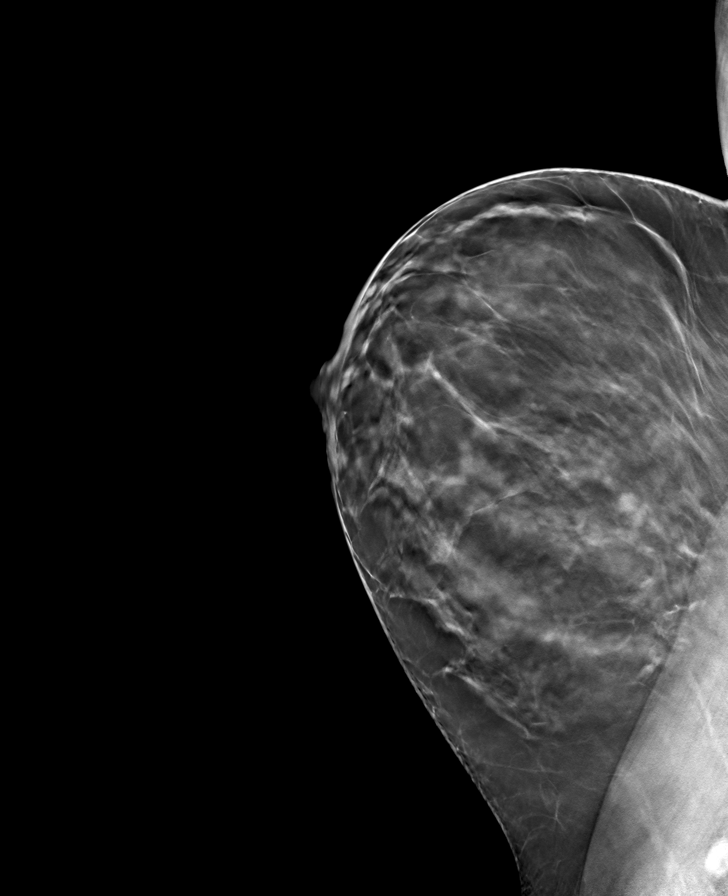

[R MLO tomo · tomo slice 45/88.0]
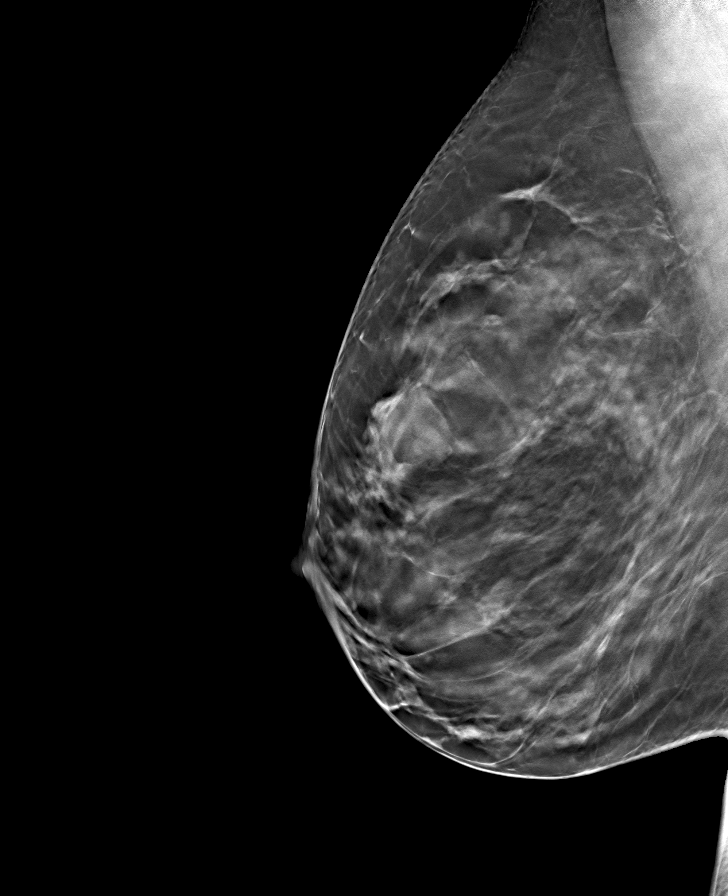

[8 of 24 positions shown; findings below may reference images not displayed]

ACR Breast Density Category c: The breast tissue is heterogeneously
dense, which may obscure small masses.
FINDINGS: In the right breast, a possible mass or focal asymmetry warrants
further evaluation. In the left breast, no findings suspicious for
malignancy. Images were processed with CAD.
IMPRESSION: Further evaluation is suggested for possible mass or focal asymmetry
in the right breast.

RECOMMENDATION:
Diagnostic mammogram and possibly ultrasound of the right breast.
(Code:[id number]-551)

The patient will be contacted regarding the findings, and additional
imaging will be scheduled.

BI-RADS CATEGORY  0: Incomplete. Need additional imaging evaluation
and/or prior mammograms for comparison.

## 2019-12-09 ENCOUNTER — Ambulatory Visit: Payer: Self-pay | Attending: Internal Medicine

## 2019-12-09 DIAGNOSIS — Z23 Encounter for immunization: Secondary | ICD-10-CM

## 2019-12-09 NOTE — Progress Notes (Signed)
   Covid-19 Vaccination Clinic  Name:  Maureen Franklin    MRN: [id number] DOB: August 05, 1971  12/09/2019  Maureen Franklin was observed post Covid-19 immunization for 15 minutes without incident. She was provided with Vaccine Information Sheet and instruction to access the V-Safe system.   Maureen Franklin was instructed to call 911 with any severe reactions post vaccine: Marland Kitchen Difficulty breathing  . Swelling of face and throat  . A fast heartbeat  . A bad rash all over body  . Dizziness and weakness   Immunizations Administered    Name Date Dose VIS Date Route   Pfizer COVID-19 Vaccine 12/09/2019  9:14 AM 0.3 mL 08/15/2019 Intramuscular   Manufacturer: [place]   Lot: [id number]   [place]: [id number]

## 2020-01-09 ENCOUNTER — Ambulatory Visit (INDEPENDENT_AMBULATORY_CARE_PROVIDER_SITE_OTHER): Payer: BC Managed Care – PPO | Admitting: Obstetrics & Gynecology

## 2020-01-09 ENCOUNTER — Encounter: Payer: Self-pay | Admitting: Obstetrics & Gynecology

## 2020-01-09 ENCOUNTER — Ambulatory Visit: Payer: BC Managed Care – PPO | Admitting: Obstetrics & Gynecology

## 2020-01-09 ENCOUNTER — Other Ambulatory Visit: Payer: Self-pay

## 2020-01-09 VITALS — BP 118/80 | Ht 64.0 in | Wt 155.0 lb

## 2020-01-09 DIAGNOSIS — N6459 Other signs and symptoms in breast: Secondary | ICD-10-CM

## 2020-01-09 MED ORDER — HYDROCORTISONE VALERATE 0.2 % EX CREA: 1.0000 "application " | TOPICAL_CREAM | Freq: Two times a day (BID) | CUTANEOUS | 2 refills | Status: DC

## 2020-01-09 NOTE — Progress Notes (Signed)
HPI:       [name] is a 49 y.o. G3P0030 who is premenopausal, presents today for a problem visit.  She complains of breast itching for four months on the right breast; has had skin itchiness in past but this is localized to the right breast only; may have had skin lesion briefly but this has come and gone; no new meds or recent trauma; no skin rash; no nipple changes, no mass, no discharge.   Prior Mammogram: 06/2019, screen was abnormal on right and diagnostic imaging was normal. Prior breast problems: No Family History: Breast Cancer-relatedfamily history includes Breast cancer (age of onset: 32) in her mother.  PMHx: She  has a past medical history of Asthma, Family history of breast cancer, Migraine, and Urticaria. Also,  has a past surgical history that includes Breast cyst aspiration (Right)., family history includes Allergic rhinitis in her sister; Breast cancer (age of onset: 42) in her mother; Pancreatic cancer (age of onset: 6) in her maternal uncle; Prostate cancer (age of onset: 74) in her father.,  reports that she has never smoked. She has never used smokeless tobacco. She reports that she does not drink alcohol or use drugs.  She has a current medication list which includes the following prescription(s): albuterol, cyclobenzaprine, fluticasone, montelukast, rizatriptan, dicyclomine, hydrocortisone valerate cream, ubrelvy, and verapamil. Also, is allergic to lac bovis.  Review of Systems  All other systems reviewed and are negative.   Objective: BP 118/80   Ht 5\' 4"  (1.626 m)   Wt 155 lb (70.3 kg)   BMI 26.61 kg/m  Physical Exam Constitutional:      General: She is not in acute distress.    Appearance: Normal appearance. She is well-developed.  Cardiovascular:     Rate and Rhythm: Normal rate and regular rhythm.     Pulses: Normal pulses.     Heart sounds: Normal heart sounds. No murmur. No friction rub. No gallop.   Pulmonary:     Effort: Pulmonary effort  is normal.     Breath sounds: Normal breath sounds.  Chest:     Chest wall: No mass, tenderness or edema.     Breasts:        Right: No inverted nipple, mass, nipple discharge, skin change or tenderness.        Left: No inverted nipple, mass, nipple discharge, skin change or tenderness.     Comments: No mass or skin changes Musculoskeletal:        General: Normal range of motion.  Lymphadenopathy:     Upper Body:     Right upper body: No pectoral adenopathy.     Left upper body: No pectoral adenopathy.  Neurological:     Mental Status: She is alert and oriented to person, place, and time.  Skin:    General: Skin is warm and dry.     Findings: No abrasion, bruising, erythema, lesion or rash.  Psychiatric:        Speech: Speech normal.        Behavior: Behavior normal.        Judgment: Judgment normal.  Vitals reviewed.     ASSESSMENT/PLAN: Breast itching. 1. Breast symptom No exam findings of mass or skin changes Will try steroid cream taper and see if improves Already takes Zyrtec daily Consider dermatology  A total of 20 minutes were spent face-to-face with the patient as well as preparation, review, communication, and documentation during this encounter.    Barnett Applebaum, MD,  [facility], [facility] 01/09/2020  4:38 PM

## 2020-04-09 ENCOUNTER — Other Ambulatory Visit: Payer: Self-pay | Admitting: Obstetrics & Gynecology

## 2020-04-09 ENCOUNTER — Telehealth: Payer: Self-pay

## 2020-04-09 DIAGNOSIS — N644 Mastodynia: Secondary | ICD-10-CM

## 2020-04-09 NOTE — Telephone Encounter (Signed)
Pt aware order in and someone will call her with the date and time.

## 2020-04-09 NOTE — Telephone Encounter (Signed)
Pt calling; finished steroid cream on breast; still has continued itching and it really hurts; hurts to lay on it; would like to have a mammogram.  [phone]

## 2020-04-09 NOTE — Telephone Encounter (Signed)
Will order, someone will call.    Maureen Franklin, Dx MMG ordered right breast

## 2020-04-12 ENCOUNTER — Telehealth: Payer: Self-pay | Admitting: Obstetrics & Gynecology

## 2020-04-12 ENCOUNTER — Other Ambulatory Visit: Payer: Self-pay | Admitting: Obstetrics & Gynecology

## 2020-04-12 DIAGNOSIS — Z1231 Encounter for screening mammogram for malignant neoplasm of breast: Secondary | ICD-10-CM

## 2020-04-12 NOTE — Telephone Encounter (Addendum)
Called to schedule Diag breast image and was told that since it is all over itching/pain and no specific area that can be pin-pointed, [facility] needs to just order a MM and not diag.  23/10 - Spoke with [employer] and was told MM will be covered even though it is early for screening (last in 06/2019) Ref# [id number]

## 2020-04-21 DIAGNOSIS — Z6827 Body mass index (BMI) 27.0-27.9, adult: Secondary | ICD-10-CM | POA: Diagnosis not present

## 2020-04-21 DIAGNOSIS — Z0000 Encounter for general adult medical examination without abnormal findings: Secondary | ICD-10-CM | POA: Diagnosis not present

## 2020-04-21 DIAGNOSIS — Z131 Encounter for screening for diabetes mellitus: Secondary | ICD-10-CM | POA: Diagnosis not present

## 2020-04-21 DIAGNOSIS — Z13220 Encounter for screening for lipoid disorders: Secondary | ICD-10-CM | POA: Diagnosis not present

## 2020-04-29 ENCOUNTER — Ambulatory Visit
Admission: RE | Admit: 2020-04-29 | Discharge: 2020-04-29 | Disposition: A | Discharge status: Home / Self Care | Payer: BC Managed Care – PPO | Source: Ambulatory Visit | Attending: Obstetrics & Gynecology | Admitting: Obstetrics & Gynecology

## 2020-04-29 ENCOUNTER — Other Ambulatory Visit: Payer: Self-pay

## 2020-04-29 DIAGNOSIS — Z1231 Encounter for screening mammogram for malignant neoplasm of breast: Secondary | ICD-10-CM | POA: Diagnosis not present

## 2020-06-07 ENCOUNTER — Other Ambulatory Visit: Payer: Self-pay | Admitting: Obstetrics & Gynecology

## 2020-06-07 DIAGNOSIS — N644 Mastodynia: Secondary | ICD-10-CM

## 2020-07-03 ENCOUNTER — Other Ambulatory Visit: Payer: Self-pay

## 2020-07-03 ENCOUNTER — Ambulatory Visit: Payer: BC Managed Care – PPO | Attending: Internal Medicine

## 2020-07-03 DIAGNOSIS — Z23 Encounter for immunization: Secondary | ICD-10-CM

## 2020-07-03 NOTE — Progress Notes (Signed)
   Covid-19 Vaccination Clinic  Name:  Maureen Franklin    MRN: [id number] DOB: 03-Jan-1971  07/03/2020  [name] was observed post Covid-19 immunization for 15 minutes without incident. She was provided with Vaccine Information Sheet and instruction to access the V-Safe system.   [name] was instructed to call 911 with any severe reactions post vaccine: Marland Kitchen Difficulty breathing  . Swelling of face and throat  . A fast heartbeat  . A bad rash all over body  . Dizziness and weakness

## 2020-10-13 ENCOUNTER — Encounter: Payer: Self-pay | Admitting: Medical

## 2020-10-13 ENCOUNTER — Other Ambulatory Visit: Payer: Self-pay

## 2020-10-13 ENCOUNTER — Ambulatory Visit: Payer: BC Managed Care – PPO | Admitting: Nurse Practitioner

## 2020-10-13 VITALS — BP 126/74 | HR 94 | Temp 99.0°F | Resp 16

## 2020-10-13 DIAGNOSIS — G43109 Migraine with aura, not intractable, without status migrainosus: Secondary | ICD-10-CM

## 2020-10-13 NOTE — Progress Notes (Signed)
Subjective:    Patient ID: Maureen Franklin, female    DOB: 11/07/1970, 50 y.o.   MRN: [id number]  HPI  50 year old female presenting to [facility] with complaints of dizziness for 2 weeks. She notices this when she stands up she has to steady herself.   She has a history of migraines was being seen at the [facility] historically, has not been back for 2 years.   Currently her PCP writes for MAXALT that she uses as needed for migraines, she will use flexeril for a migraine that is not relieved with MAXALT. Also gets massage once a month for migraine prevention.   She has about one migraine a week on average, and needs Flexeril about once a month.   She has had 2 migraines already in the past week.   She has also had ne Headache pain on the right side that is different from migraine pain that seems to stay present and dull and intensifies at times.   She does note that she has been under stress lately, her father has been in and out of the hospital out of state and she will be traveling back to see him tomorrow.   She has not been exercising recently, notes diet has not been on track- she tends not to eat when stressed and has lost weight unintentionally.   Not currently using allergy medications, denies allergy symptoms or any congestion or recent illness.  Denies a history of COVID, she has been vaccinated and boosted.   Past Medical History:  Diagnosis Date  . Asthma   . Family history of breast cancer    cancer genetic testing letter sent 11/20  . Migraine   . Urticaria    Vitals:   10/13/20 1521  BP: 126/74  Pulse: 94  Resp: 16  Temp: 99 F (37.2 C)  SpO2: 100%   ORTHOSTATICS: Right ARM Adult CUFF  Sitting: 110/62 pulse 89 Laying down: 112/60 pulse 90 Standing: 105/60 pulse 93  Non symptomatic (no dizziness) with orthostatics in office.   Review of Systems  Constitutional: Positive for unexpected weight change.  HENT: Negative.   Respiratory: Negative.    Genitourinary: Negative.   Neurological: Positive for dizziness and headaches.       Objective:   Physical Exam Constitutional:      Appearance: Normal appearance.  HENT:     Head: Normocephalic.     Right Ear: Tympanic membrane, ear canal and external ear normal.     Left Ear: Tympanic membrane and external ear normal.     Nose: Nose normal. No congestion.     Mouth/Throat:     Mouth: Mucous membranes are moist.     Pharynx: Oropharynx is clear.  Eyes:     Extraocular Movements: Extraocular movements intact.     Conjunctiva/sclera: Conjunctivae normal.     Pupils: Pupils are equal, round, and reactive to light.  Cardiovascular:     Rate and Rhythm: Normal rate and regular rhythm.     Heart sounds: Normal heart sounds.  Musculoskeletal:     Cervical back: Normal range of motion.  Skin:    General: Skin is warm and dry.  Neurological:     General: No focal deficit present.     Mental Status: She is alert and oriented to person, place, and time.  Psychiatric:        Mood and Affect: Mood normal.        Behavior: Behavior normal.  Current Outpatient Medications  Medication Instructions  . cyclobenzaprine (FLEXERIL) 5 MG tablet Oral  . hydrocortisone valerate cream (WESTCORT) 0.2 % 1 application, Topical, 2 times daily, For one week to affected area (breast), then once daily for one week, then every other day for one week.  . montelukast (SINGULAIR) 10 MG tablet TAKE 1 TABLET(10 MG) BY MOUTH AT BEDTIME  . rizatriptan (MAXALT) 10 mg, Oral, As needed, May repeat in 2 hours if needed         Assessment & Plan:  Patient exam stable in office- Advised making a follow up appointment with headache clinic. Symptoms are likely related to migraines, frequency is increasing and is likely being exacerbated by current stressors. Also with new HA symptoms on right side and onset of recurrent dizziness  Patient will call clinic if she needs a new referral to Headache clinic.    Discussed self care and stress management with patient. Encouraged to carry high protein snacks and concentrate on hydration.   She will return to [facility] with new or worsening symptoms as discussed.

## 2020-10-18 ENCOUNTER — Encounter: Payer: Self-pay | Admitting: Obstetrics & Gynecology

## 2020-10-18 ENCOUNTER — Other Ambulatory Visit: Payer: Self-pay

## 2020-10-18 ENCOUNTER — Ambulatory Visit (INDEPENDENT_AMBULATORY_CARE_PROVIDER_SITE_OTHER): Payer: BC Managed Care – PPO | Admitting: Obstetrics & Gynecology

## 2020-10-18 ENCOUNTER — Other Ambulatory Visit (HOSPITAL_COMMUNITY)
Admission: RE | Admit: 2020-10-18 | Discharge: 2020-10-18 | Disposition: A | Discharge status: Home / Self Care | Payer: BC Managed Care – PPO | Source: Ambulatory Visit | Attending: Obstetrics & Gynecology | Admitting: Obstetrics & Gynecology

## 2020-10-18 VITALS — BP 100/70 | Ht 64.0 in | Wt 167.0 lb

## 2020-10-18 DIAGNOSIS — Z124 Encounter for screening for malignant neoplasm of cervix: Secondary | ICD-10-CM

## 2020-10-18 DIAGNOSIS — N920 Excessive and frequent menstruation with regular cycle: Secondary | ICD-10-CM | POA: Diagnosis not present

## 2020-10-18 MED ORDER — MEDROXYPROGESTERONE ACETATE 10 MG PO TABS: 10.0000 mg | ORAL_TABLET | Freq: Every day | ORAL | 0 refills | Status: DC

## 2020-10-18 NOTE — Patient Instructions (Signed)
Transvaginal Ultrasound A transvaginal ultrasound, also called an endovaginal ultrasound, is a test that uses sound waves to take pictures of the female genital tract. The pictures are taken with a device, called a transducer, that is placed in the vagina. This test may be done to:  Check for problems with your pregnancy.  Check your developing baby.  Check for anything abnormal in the uterus or ovaries.  Find out why you have pelvic pain or bleeding. Tell a health care provider about:  Any allergies you have.  All medicines you are taking, including vitamins, herbs, eye drops, creams, and over-the-counter medicines.  Any blood disorders you have.  Any surgeries you have had.  Any medical conditions you have.  Whether you are pregnant or may be pregnant.  Whether you are having your menstrual period. What are the risks? This is a safe procedure. There are no known risks or complications of having this test. What happens before the procedure? This procedure needs to be done when your bladder is empty. Follow your health care provider's instructions about drinking fluids and emptying your bladder before the test. What happens during the procedure?  You will empty your bladder before the procedure.  You will undress from the waist down.  You will lie down on an exam table, with your knees bent and your feet in foot holders.  A health care provider will cover the transducer with a sterile cover.  A gel will be put on the transducer. The gel helps transmit the sound waves and prevents irritation of your vagina.  The technician will insert the transducer into your vagina to get images. These will be displayed on a monitor that looks like a small television screen.  The transducer will be removed when the procedure is complete. The procedure may vary among health care providers and hospitals.   What happens after the procedure?  It is up to you to get the results of your  procedure. Ask your health care provider, or the department that is doing the procedure, when your results will be ready.  Keep all follow-up visits as told by your health care provider. This is important. Summary  A transvaginal ultrasound, also called an endovaginal ultrasound, is a test that uses sound waves to take pictures of the female genital tract.  This is a safe procedure. There are no known risks associated with this test.  The procedure needs to be done when your bladder is empty. Follow your health care provider's instructions about drinking fluids and emptying your bladder before the test.  During the procedure, you will undress from the waist down and lie down on an exam table. A technician will insert a transducer into your vagina to obtain images.  Ask your health care provider, or the department that is doing the procedure, when your results will be ready. This information is not intended to replace advice given to you by your health care provider. Make sure you discuss any questions you have with your health care provider. Document Revised: 04/03/2018 Document Reviewed: 04/03/2018 Elsevier Patient Education  [place].

## 2020-10-18 NOTE — Progress Notes (Signed)
HPI:      [name] is a 50 y.o. G3P0030 who LMP was Patient's last menstrual period was 10/02/2020., presents today for a problem visit.  She complains of menorrhagia that  began several weeks ago and its severity is described as moderate.  She has regular periods every 28 days and they are associated with mild menstrual cramping.  Then, she has had a now 16 day bleed with heavy flow most of these days.  She has used the following for attempts at control: tampon and pad.  Previous evaluation: none. Prior Diagnosis: none, and no FH fibroids. Previous Treatment: none.  PMHx: She  has a past medical history of Asthma, Family history of breast cancer, Migraine, and Urticaria. Also,  has a past surgical history that includes Breast cyst aspiration (Right)., family history includes Allergic rhinitis in her sister; Breast cancer (age of onset: 48) in her mother; Pancreatic cancer (age of onset: 89) in her maternal uncle; Prostate cancer (age of onset: 31) in her father.,  reports that she has never smoked. She has never used smokeless tobacco. She reports that she does not drink alcohol and does not use drugs.  She  Current Outpatient Medications:  .  cyclobenzaprine (FLEXERIL) 5 MG tablet, Take by mouth., Disp: , Rfl:  .  hydrocortisone valerate cream (WESTCORT) 0.2 %, Apply 1 application topically 2 (two) times daily. For one week to affected area (breast), then once daily for one week, then every other day for one week., Disp: 60 g, Rfl: 2 .  medroxyPROGESTERone (PROVERA) 10 MG tablet, Take 1 tablet (10 mg total) by mouth daily for 10 days., Disp: 10 tablet, Rfl: 0 .  montelukast (SINGULAIR) 10 MG tablet, TAKE 1 TABLET(10 MG) BY MOUTH AT BEDTIME, Disp: , Rfl:  .  rizatriptan (MAXALT) 10 MG tablet, Take 10 mg by mouth as needed for migraine. May repeat in 2 hours if needed, Disp: , Rfl:   Also, is allergic to lac bovis.  Review of Systems  Constitutional: Positive for malaise/fatigue.  Negative for chills and fever.  HENT: Negative for congestion, sinus pain and sore throat.   Eyes: Negative for blurred vision and pain.  Respiratory: Negative for cough and wheezing.   Cardiovascular: Negative for chest pain and leg swelling.  Gastrointestinal: Negative for abdominal pain, constipation, diarrhea, heartburn, nausea and vomiting.  Genitourinary: Negative for dysuria, frequency, hematuria and urgency.  Musculoskeletal: Negative for back pain, joint pain, myalgias and neck pain.  Skin: Negative for itching and rash.  Neurological: Positive for dizziness. Negative for tremors and weakness.  Endo/Heme/Allergies: Does not bruise/bleed easily.  Psychiatric/Behavioral: Negative for depression. The patient is not nervous/anxious and does not have insomnia.     Objective: BP 100/70   Ht 5\' 4"  (1.626 m)   Wt 167 lb (75.8 kg)   LMP 10/02/2020   BMI 28.67 kg/m  Physical Exam Constitutional:      General: She is not in acute distress.    Appearance: She is well-developed.  Genitourinary:     Uterus normal.     Vaginal bleeding present.     No vaginal erythema.      Right Adnexa: not tender and no mass present.    Left Adnexa: not tender and no mass present.    No cervical motion tenderness, discharge, polyp or nabothian cyst.     Uterus is mobile.     Uterus is not enlarged.     No uterine mass detected.  Uterus is midaxial and midaxial.     Pelvic exam was performed with patient in the lithotomy position.  HENT:     Head: Normocephalic and atraumatic.     Nose: Nose normal.  Abdominal:     General: There is no distension.     Palpations: Abdomen is soft.     Tenderness: There is no abdominal tenderness.  Musculoskeletal:        General: Normal range of motion.  Neurological:     Mental Status: She is alert and oriented to person, place, and time.     Cranial Nerves: No cranial nerve deficit.  Skin:    General: Skin is warm and dry.  Psychiatric:         Attention and Perception: Attention normal.        Mood and Affect: Mood and affect normal.        Speech: Speech normal.        Behavior: Behavior normal.        Thought Content: Thought content normal.        Judgment: Judgment normal.     ASSESSMENT/PLAN:  menorrhagia  Problem List Items Addressed This Visit    Visit Diagnoses    Menorrhagia with regular cycle    -  Primary   Relevant Medications   medroxyPROGESTERone (PROVERA) 10 MG tablet   Other Relevant Orders   US PELVIC COMPLETE WITH TRANSVAGINAL   Screening for cervical cancer       Relevant Orders   Cytology - PAP    Patient has abnormal uterine bleeding . She has a normal exam today, with no evidence of lesions.  Evaluation includes the following: exam, labs such as hormonal testing, and pelvic ultrasound to evaluate for any structural gynecologic abnormalities.  Patient to follow up after testing.  Treatment option for menorrhagia or menometrorrhagia discussed in great detail with the patient.  Options include hormonal therapy, IUD therapy such as Mirena, D&C, Ablation, and Hysterectomy.  The pros and cons of each option discussed with patient.     Barnett Applebaum, MD, [facility], [facility] 10/18/2020  2:27 PM

## 2020-10-19 DIAGNOSIS — G43009 Migraine without aura, not intractable, without status migrainosus: Secondary | ICD-10-CM | POA: Diagnosis not present

## 2020-10-20 LAB — CYTOLOGY - PAP
Comment: NEGATIVE
Diagnosis: NEGATIVE
High risk HPV: NEGATIVE

## 2020-11-01 ENCOUNTER — Ambulatory Visit: Payer: BC Managed Care – PPO | Admitting: Obstetrics & Gynecology

## 2020-11-01 ENCOUNTER — Ambulatory Visit: Payer: BC Managed Care – PPO

## 2020-11-01 DIAGNOSIS — N920 Excessive and frequent menstruation with regular cycle: Secondary | ICD-10-CM

## 2020-11-02 ENCOUNTER — Ambulatory Visit
Admission: RE | Admit: 2020-11-02 | Discharge: 2020-11-02 | Disposition: A | Discharge status: Home / Self Care | Payer: BC Managed Care – PPO | Source: Ambulatory Visit | Attending: Obstetrics & Gynecology | Admitting: Obstetrics & Gynecology

## 2020-11-02 ENCOUNTER — Other Ambulatory Visit: Payer: Self-pay

## 2020-11-02 DIAGNOSIS — N920 Excessive and frequent menstruation with regular cycle: Secondary | ICD-10-CM | POA: Insufficient documentation

## 2020-11-02 DIAGNOSIS — D259 Leiomyoma of uterus, unspecified: Secondary | ICD-10-CM | POA: Diagnosis not present

## 2020-11-04 ENCOUNTER — Other Ambulatory Visit: Payer: Self-pay

## 2020-11-04 ENCOUNTER — Encounter: Payer: Self-pay | Admitting: Obstetrics & Gynecology

## 2020-11-04 ENCOUNTER — Ambulatory Visit (INDEPENDENT_AMBULATORY_CARE_PROVIDER_SITE_OTHER): Payer: BC Managed Care – PPO | Admitting: Obstetrics & Gynecology

## 2020-11-04 VITALS — BP 100/70 | Ht 64.0 in | Wt 165.0 lb

## 2020-11-04 DIAGNOSIS — N920 Excessive and frequent menstruation with regular cycle: Secondary | ICD-10-CM | POA: Diagnosis not present

## 2020-11-04 DIAGNOSIS — D219 Benign neoplasm of connective and other soft tissue, unspecified: Secondary | ICD-10-CM

## 2020-11-04 NOTE — Progress Notes (Signed)
  HPI: Pt has had a more worrisome and heavy/prolonged period this past month, after usually having reg 28 day cycles, and no s/sx menopause, and FH menopause late onset (mid 80s).  Provera stopped bleeding on day 2, no further bleeding since or even after stopping the 10 day course.  Ultrasound demonstrates one small 2 cm fibroid.  PMHx: She  has a past medical history of Asthma, Family history of breast cancer, Migraine, and Urticaria. Also,  has a past surgical history that includes Breast cyst aspiration (Right)., family history includes Allergic rhinitis in her sister; Breast cancer (age of onset: 1) in her mother; Pancreatic cancer (age of onset: 38) in her maternal uncle; Prostate cancer (age of onset: 12) in her father.,  reports that she has never smoked. She has never used smokeless tobacco. She reports that she does not drink alcohol and does not use drugs.  She has a current medication list which includes the following prescription(s): cyclobenzaprine, hydrocortisone valerate cream, montelukast, rizatriptan, and medroxyprogesterone. Also, is allergic to lac bovis.  Review of Systems  All other systems reviewed and are negative.   Objective: BP 100/70   Ht 5\' 4"  (1.626 m)   Wt 165 lb (74.8 kg)   BMI 28.32 kg/m   Physical examination Constitutional NAD, Conversant  Skin No rashes, lesions or ulceration.   Extremities: Moves all appropriately.  Normal ROM for age. No lymphadenopathy.  Neuro: Grossly intact  Psych: Oriented to PPT.  Normal mood. Normal affect.   Assessment:  Fibroid  Menorrhagia with regular cycle   Options discussed, as the fibroid is small and this is the first abnormal period, will monitor cycles for patterns of bleeding.  Surgery and fibroid tx options discussed. Hormonal therapy for chronic menorrhagia discussed as well.  Fibroid treatment such as [place], Lupron, Myomectomy, and Hysterectomy discussed in detail, with the pros and cons of each choice  counseled.  No treatment as an option also discussed, as well as control of symptoms alone with hormone therapy. Information provided to the patient.  A total of 20 minutes were spent face-to-face with the patient as well as preparation, review, communication, and documentation during this encounter.   Barnett Applebaum, MD, [facility], [facility] 11/04/2020  5:03 PM

## 2020-11-05 DIAGNOSIS — G8929 Other chronic pain: Secondary | ICD-10-CM | POA: Diagnosis not present

## 2020-11-05 DIAGNOSIS — M25562 Pain in left knee: Secondary | ICD-10-CM | POA: Diagnosis not present

## 2020-11-05 DIAGNOSIS — M25561 Pain in right knee: Secondary | ICD-10-CM | POA: Diagnosis not present

## 2020-11-22 DIAGNOSIS — G8929 Other chronic pain: Secondary | ICD-10-CM | POA: Diagnosis not present

## 2020-11-22 DIAGNOSIS — M25562 Pain in left knee: Secondary | ICD-10-CM | POA: Diagnosis not present

## 2020-11-22 DIAGNOSIS — M25561 Pain in right knee: Secondary | ICD-10-CM | POA: Diagnosis not present

## 2020-12-07 DIAGNOSIS — M25562 Pain in left knee: Secondary | ICD-10-CM | POA: Diagnosis not present

## 2020-12-07 DIAGNOSIS — M25561 Pain in right knee: Secondary | ICD-10-CM | POA: Diagnosis not present

## 2020-12-07 DIAGNOSIS — G8929 Other chronic pain: Secondary | ICD-10-CM | POA: Diagnosis not present

## 2020-12-24 DIAGNOSIS — M25561 Pain in right knee: Secondary | ICD-10-CM | POA: Diagnosis not present

## 2020-12-24 DIAGNOSIS — M25562 Pain in left knee: Secondary | ICD-10-CM | POA: Diagnosis not present

## 2020-12-24 DIAGNOSIS — G8929 Other chronic pain: Secondary | ICD-10-CM | POA: Diagnosis not present

## 2020-12-28 DIAGNOSIS — G8929 Other chronic pain: Secondary | ICD-10-CM | POA: Diagnosis not present

## 2020-12-28 DIAGNOSIS — M25562 Pain in left knee: Secondary | ICD-10-CM | POA: Diagnosis not present

## 2020-12-28 DIAGNOSIS — M25561 Pain in right knee: Secondary | ICD-10-CM | POA: Diagnosis not present

## 2021-01-04 DIAGNOSIS — M25561 Pain in right knee: Secondary | ICD-10-CM | POA: Diagnosis not present

## 2021-01-04 DIAGNOSIS — M25562 Pain in left knee: Secondary | ICD-10-CM | POA: Diagnosis not present

## 2021-01-04 DIAGNOSIS — G8929 Other chronic pain: Secondary | ICD-10-CM | POA: Diagnosis not present

## 2021-01-06 DIAGNOSIS — M25562 Pain in left knee: Secondary | ICD-10-CM | POA: Diagnosis not present

## 2021-01-06 DIAGNOSIS — G8929 Other chronic pain: Secondary | ICD-10-CM | POA: Diagnosis not present

## 2021-01-06 DIAGNOSIS — M25561 Pain in right knee: Secondary | ICD-10-CM | POA: Diagnosis not present

## 2021-01-11 DIAGNOSIS — M25561 Pain in right knee: Secondary | ICD-10-CM | POA: Diagnosis not present

## 2021-01-11 DIAGNOSIS — G8929 Other chronic pain: Secondary | ICD-10-CM | POA: Diagnosis not present

## 2021-01-11 DIAGNOSIS — M25562 Pain in left knee: Secondary | ICD-10-CM | POA: Diagnosis not present

## 2021-04-22 DIAGNOSIS — L603 Nail dystrophy: Secondary | ICD-10-CM | POA: Diagnosis not present

## 2021-04-22 DIAGNOSIS — Z6828 Body mass index (BMI) 28.0-28.9, adult: Secondary | ICD-10-CM | POA: Diagnosis not present

## 2021-04-22 DIAGNOSIS — Z131 Encounter for screening for diabetes mellitus: Secondary | ICD-10-CM | POA: Diagnosis not present

## 2021-04-22 DIAGNOSIS — Z0000 Encounter for general adult medical examination without abnormal findings: Secondary | ICD-10-CM | POA: Diagnosis not present

## 2021-04-22 DIAGNOSIS — Z13220 Encounter for screening for lipoid disorders: Secondary | ICD-10-CM | POA: Diagnosis not present

## 2021-04-28 IMAGING — US US PELVIS COMPLETE WITH TRANSVAGINAL
1 series · 14 of 25 positions shown · non-contrast
Comparison: None

CLINICAL DATA: Menorrhagia

EXAM:
TRANSABDOMINAL AND TRANSVAGINAL ULTRASOUND OF PELVIS
TECHNIQUE: Both transabdominal and transvaginal ultrasound examinations of the
pelvis were performed. Transabdominal technique was performed for
global imaging of the pelvis including uterus, ovaries, adnexal
regions, and pelvic cul-de-sac. It was necessary to proceed with
endovaginal exam following the transabdominal exam to visualize the
uterus endometrium ovaries.

[Series 1: us pelvic complete with transvaginal · 104 acquisitions, 14 frames shown]
[im 1/104]
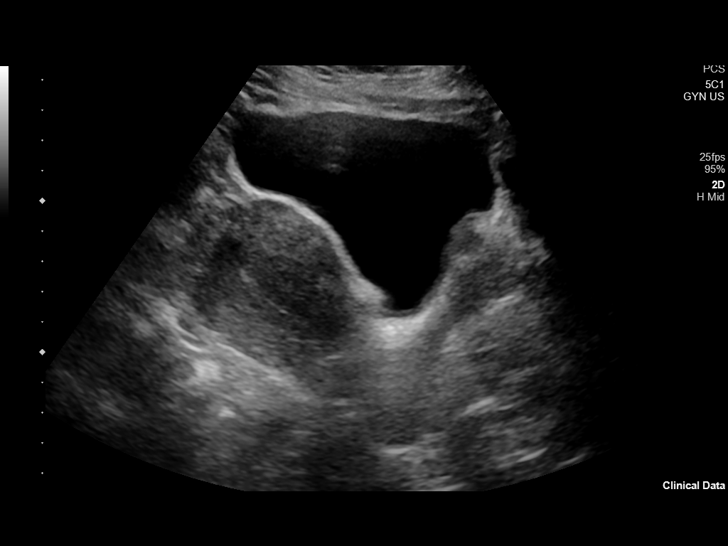
[im 9/104]
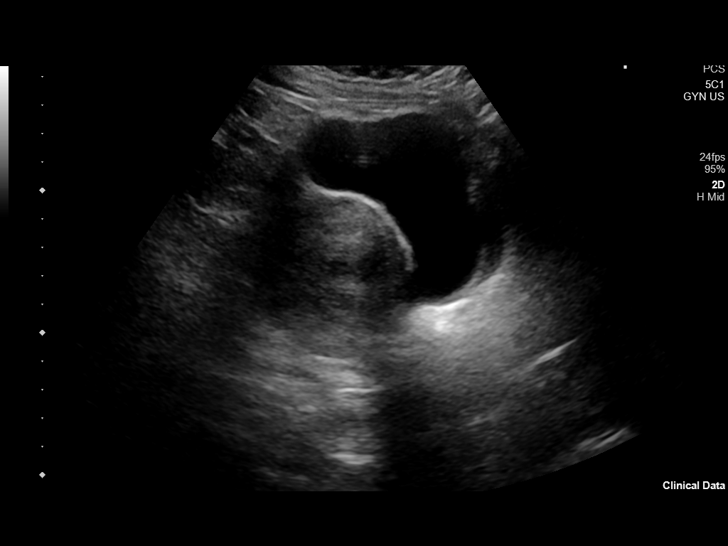
[im 18/104]
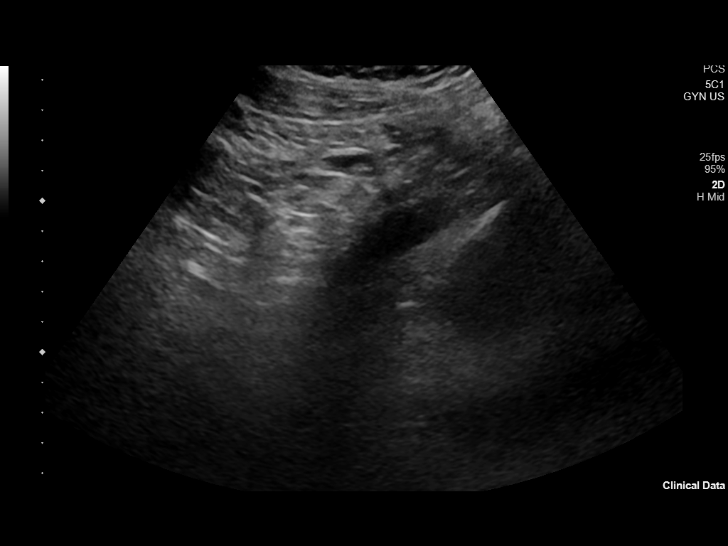
[im 26/104]
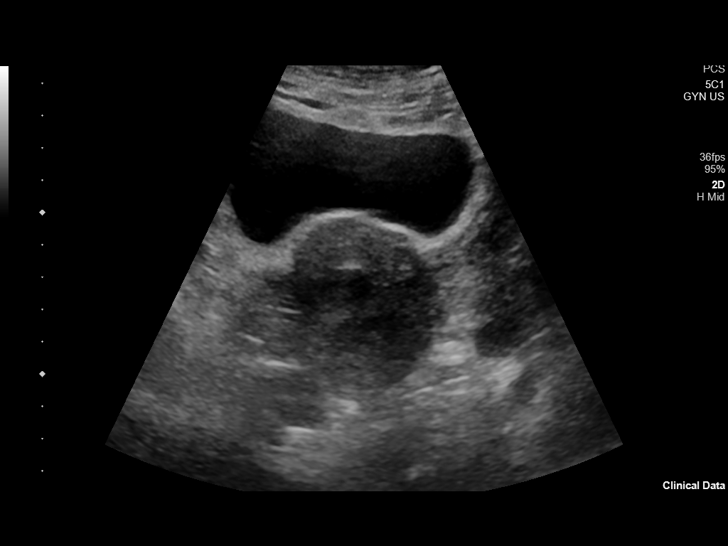
[im 35/104]
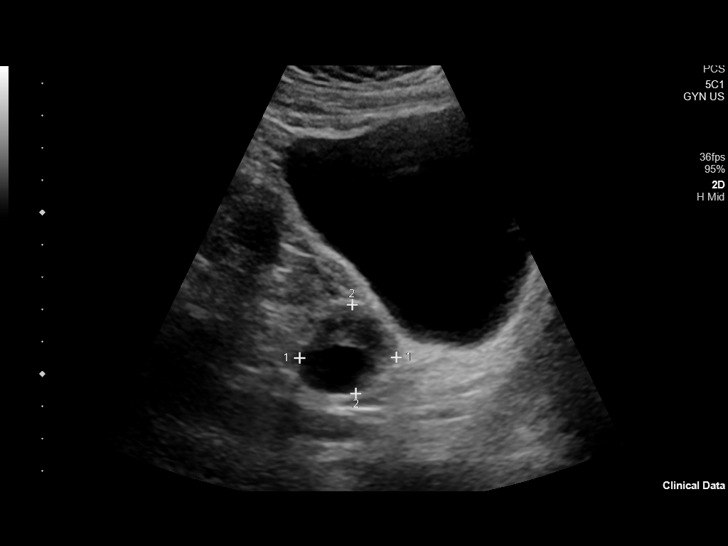
[im 39/104]
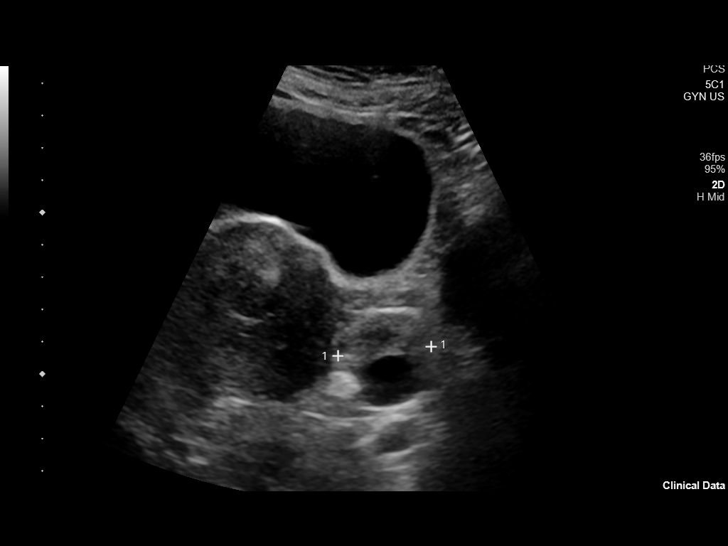
[im 48/104]
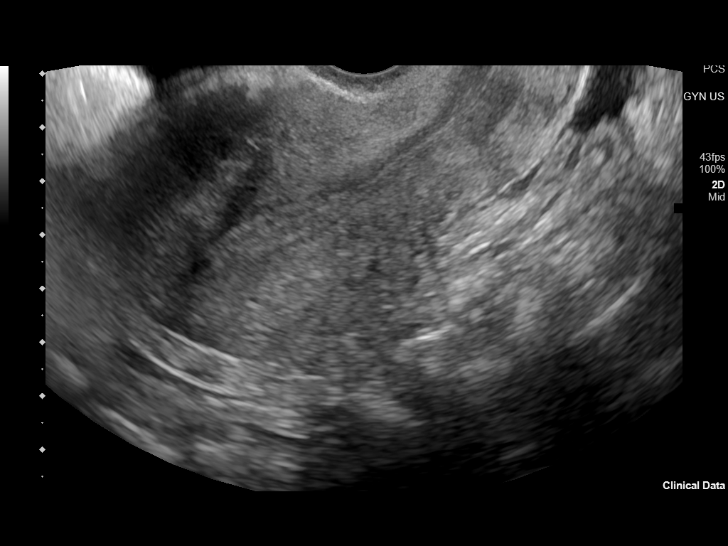
[im 56/104]
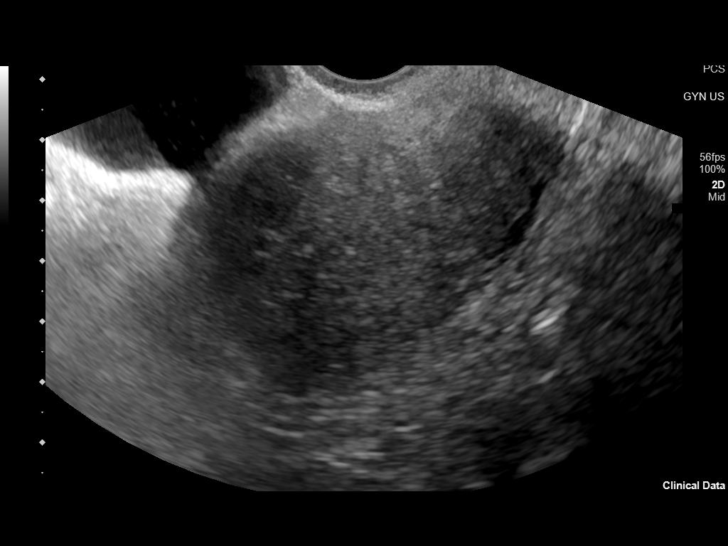
[im 65/104]
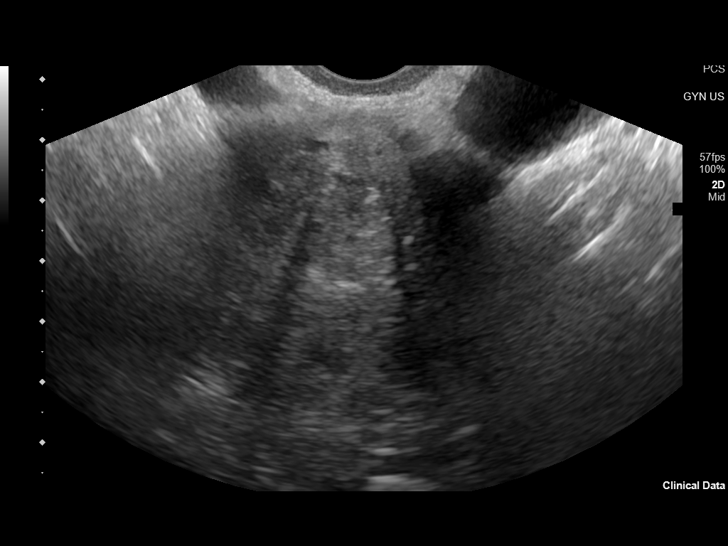
[im 69/104]
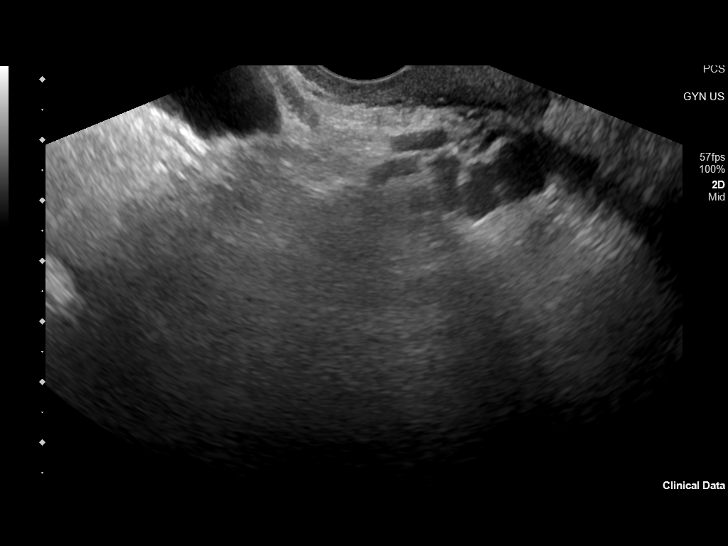
[im 78/104]
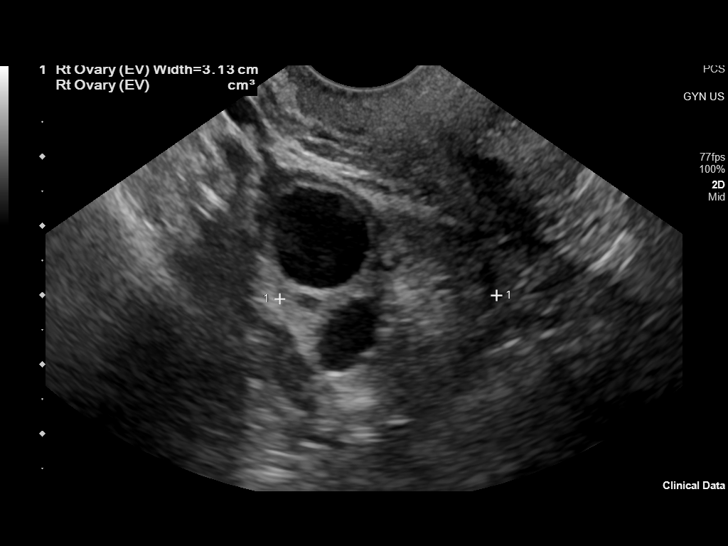
[im 86/104]
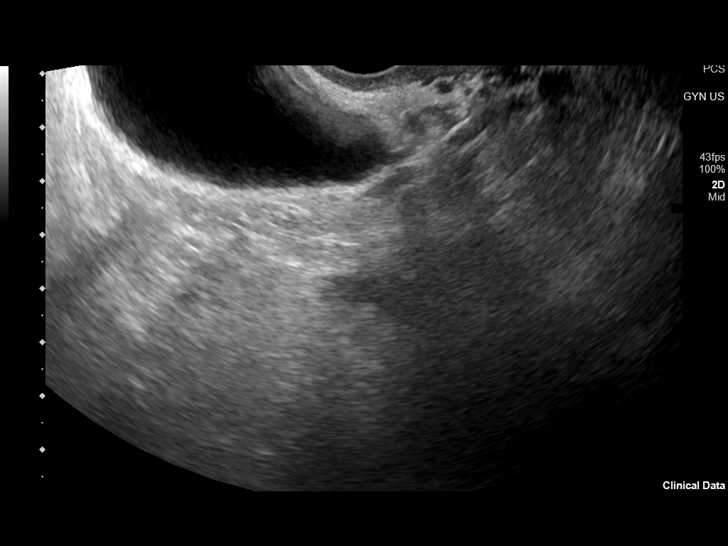
[im 95/104]
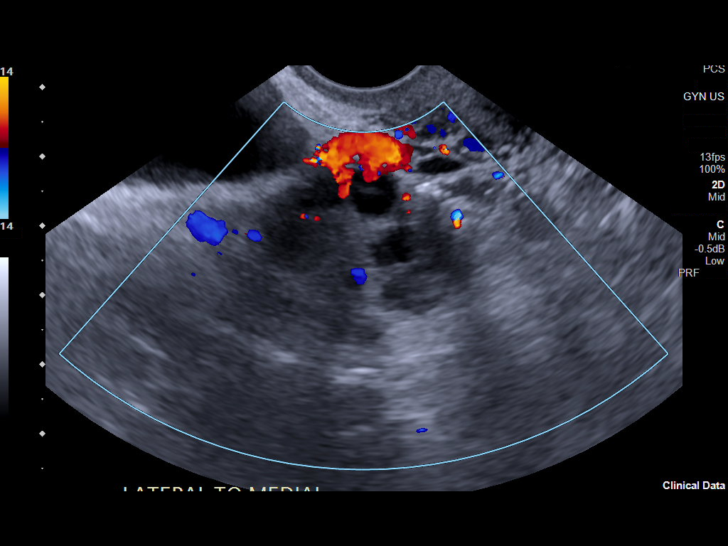
[im 104/104]
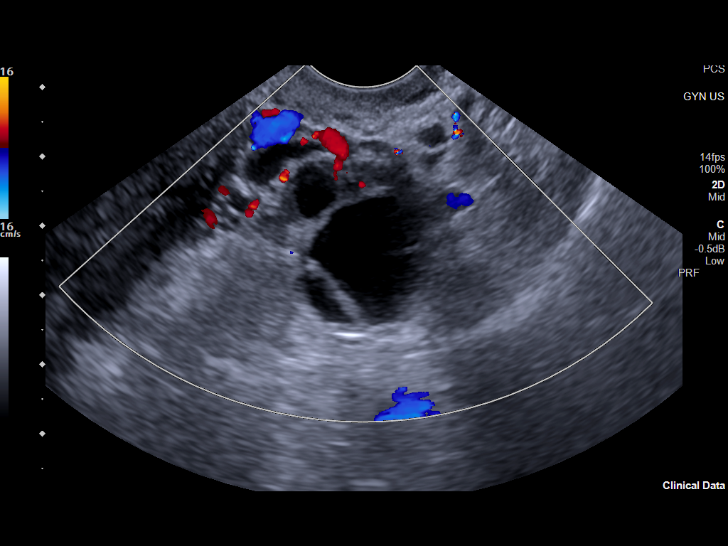

[14 of 25 positions shown; findings below may reference images not displayed]

FINDINGS: Uterus

Measurements: 10 x 5 x 5.6 cm = volume: 145.2 mL. Anterior fundal
intramural mass measuring 1.7 x 1.9 x 1.3 cm consistent with
fibroid.

Endometrium

Thickness: 7.9 mm.  No focal abnormality visualized.

Right ovary

Measurements: 3.2 x 2.9 x 3.1 cm = volume: 15.5 mL. Normal
appearance/no adnexal mass.

Left ovary

Measurements: 3.5 x 2.5 x 3 cm = volume: 13.8 mL. Normal
appearance/no adnexal mass.

Other findings

Trace free fluid.
IMPRESSION: 1. Normal premenopausal endometrial thickness.
2. Small uterine fibroid.
3. Trace free fluid in the pelvis.

## 2021-06-20 DIAGNOSIS — Z23 Encounter for immunization: Secondary | ICD-10-CM | POA: Diagnosis not present

## 2021-09-23 DIAGNOSIS — G43009 Migraine without aura, not intractable, without status migrainosus: Secondary | ICD-10-CM | POA: Diagnosis not present

## 2021-12-30 ENCOUNTER — Ambulatory Visit: Payer: BC Managed Care – PPO | Admitting: Allergy

## 2021-12-30 ENCOUNTER — Encounter: Payer: Self-pay | Admitting: Allergy

## 2021-12-30 VITALS — BP 120/76 | HR 74 | Temp 98.4°F | Resp 18 | Ht 64.17 in | Wt 172.2 lb

## 2021-12-30 DIAGNOSIS — J4520 Mild intermittent asthma, uncomplicated: Secondary | ICD-10-CM | POA: Diagnosis not present

## 2021-12-30 DIAGNOSIS — K9049 Malabsorption due to intolerance, not elsewhere classified: Secondary | ICD-10-CM | POA: Diagnosis not present

## 2021-12-30 DIAGNOSIS — L508 Other urticaria: Secondary | ICD-10-CM

## 2021-12-30 DIAGNOSIS — J3089 Other allergic rhinitis: Secondary | ICD-10-CM

## 2021-12-30 MED ORDER — AZELASTINE HCL 0.1 % NA SOLN: 2.0000 | Freq: Two times a day (BID) | NASAL | 5 refills | Status: AC

## 2021-12-30 MED ORDER — FAMOTIDINE 20 MG PO TABS: 20.0000 mg | ORAL_TABLET | Freq: Two times a day (BID) | ORAL | 5 refills | Status: AC

## 2021-12-30 MED ORDER — CETIRIZINE HCL 10 MG PO TABS: 10.0000 mg | ORAL_TABLET | Freq: Two times a day (BID) | ORAL | 5 refills | Status: AC

## 2021-12-30 NOTE — Patient Instructions (Addendum)
Chronic hives ?- at this time etiology of hives and swelling is unknown.  Hives can be caused by a variety of different triggers including illness/infection, foods, medications, stings, exercise, pressure, vibrations, extremes of temperature to name a few however majority of the time there is no identifiable trigger.  Your symptoms have been ongoing for >6 weeks making this chronic thus will obtain labwork to evaluate: CBC w diff, CMP, tryptase, hive panel, environmental panel, alpha-gal panel ?- for hive management recommend following high-dose antihistamine regimen: Zyrtec 1 tab twice a day with Pepcid 1 tab twice a day.   Zyrtec is a histamine 1 blocker and Pepcid is a histamine 2 blocker.  ?- if histamine 1/2 blockers are not effective in controlling hives then would add in Singulair, an anti-leukotriene medication ?- if triple therapy is not effective enough in controlling hives then would advance to Xolair monthly injections for chronic spontaneous hive control.  Will discuss Xolair in more details in future if needed ? ?Allergic rhinitis ?- continue avoidance measures for ragweed, tree pollen and dust mite (these were positive on testing from 2018).  ?- as above will obtain environmental allergy panel ?- continue Zyrtec as above ?- for nasal drainage control recommend use of Astepro, nasal antihistamine, that is OTC.  Use 2 sprays each nostril twice a day as needed for drainage ? ?Food intolerance ?- continue avoidance of dairy/lactose-contain products ? ?Reactive airway ?- let us know if you develop symptoms of cough, wheeze, shortness of breath, chest tightness.  May have symptoms with respiratory illness, worsening allergy symptoms, physical activity, strong odor exposures etc ? ?Follow-up in 2-3 months or sooner if needed ? ? ? ?

## 2021-12-30 NOTE — Addendum Note (Signed)
Addended by: Eloy End D on: 12/30/2021 05:16 PM ? ? Modules accepted: Orders ? ?

## 2021-12-30 NOTE — Progress Notes (Signed)
? ? ?New Patient Note ? ?RE: Maureen Franklin MRN: [id number] DOB: [birth year] ?Date of Office Visit: 12/30/2021 ? ?Primary care provider: Marjory Sneddon, MD ? ?Chief Complaint: Allergies, hives ? ?History of present illness: ?Maureen Franklin is a 51 y.o. female presenting today for evaluation of allergic rhinitis and urticaria.  She also has history of asthma. ?She is a former patient of the practice with her last visit on 01/29/2018 with Dr. Shaune Leeks.  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 ? ?She states her hives are getting worse.  She states she can be sitting and just break out with a red, rash that can look swollen.  The rash can especially occur in her underarm area.  Can last about 10 minutes or so before going down.  She has been having this issue for at least past 5 years but the past year hives are getting worse and more frequent.  Hives do not leave any bruising marks behind.  No swelling.  No joint aches/pains.  No fevers.  Not pressure induced.  Can occur in any environment.  Not worsen with hot showering.  Hives did not flare following any vaccine however states after 2nd covid vaccine the arm with the injection did swell, get itchy and red.  Has not had any change of soaps/detergents/lotions.  No new medications or foods, no stings/bites.   ?She takes zyrtec 1 tab nightly for years now.  She did have HC cream to use in the past for the rash but it was not that helpful. ? ?With her environmental allergies she has lot of nasal drainage and throat clearing and sneezing.  Spring is worse time for her.  Zyrtec helps some.  She has used nasal sprays like flonase in the past. Has not done allergy shots.  ?Her last allergy testing was from 2018 that was positive to ragweed, tree pollens and dust mites. ? ?She states she has never experienced symptoms with asthma however states she was told by Dr. Shaune Leeks she had "a touch of asthma".  She states she could have had a URI back then and may have  had some shortness of breath.   ? ?No eczema or food allergy. She does report lactose intolerance.  ? ?Review of systems: ?Review of Systems  ?Constitutional: Negative.   ?HENT: Negative.    ?Eyes: Negative.   ?Respiratory: Negative.    ?Cardiovascular: Negative.   ?Gastrointestinal: Negative.   ?Musculoskeletal: Negative.   ?Skin: Negative.   ?Allergic/Immunologic: Negative.   ?Neurological: Negative.   ? ?All other systems negative unless noted above in HPI ? ?Past medical history: ?Past Medical History:  ?Diagnosis Date  ? Asthma   ? Family history of breast cancer   ? cancer genetic testing letter sent 11/20  ? Migraine   ? Urticaria   ? ? ?Past surgical history: ?Past Surgical History:  ?Procedure Laterality Date  ? BREAST CYST ASPIRATION Right   ? neg  ? ? ?Family history:  ?Family History  ?Problem Relation Age of Onset  ? Breast cancer Mother 56  ? Prostate cancer Father 53  ? Allergic rhinitis Sister   ? Pancreatic cancer Maternal Uncle 83  ? Angioedema Neg Hx   ? Asthma Neg Hx   ? Eczema Neg Hx   ? Immunodeficiency Neg Hx   ? Urticaria Neg Hx   ? ? ?Social history: ?Lives in a home with carpeting with central cooling.  No pets in the home.  No concern for  water damage, mildew or roaches in the home. She works on a Teaching laboratory technician.  Denies smoking history.   ? ? ?Medication List: ?Current Outpatient Medications  ?Medication Sig Dispense Refill  ? cyclobenzaprine (FLEXERIL) 5 MG tablet Take by mouth.    ? hydrocortisone valerate cream (WESTCORT) 0.2 % Apply 1 application topically 2 (two) times daily. For one week to affected area (breast), then once daily for one week, then every other day for one week. 60 g 2  ? montelukast (SINGULAIR) 10 MG tablet TAKE 1 TABLET(10 MG) BY MOUTH AT BEDTIME    ? rizatriptan (MAXALT) 10 MG tablet Take 10 mg by mouth as needed for migraine. May repeat in 2 hours if needed    ? baclofen (LIORESAL) 10 MG tablet Take 5-10 mg by mouth 3 (three) times daily as needed.    ? cetirizine  (ZYRTEC) 10 MG tablet Take by mouth.    ? medroxyPROGESTERone (PROVERA) 10 MG tablet Take 1 tablet (10 mg total) by mouth daily for 10 days. 10 tablet 0  ? ?No current facility-administered medications for this visit.  ? ? ?Known medication allergies: ?Allergies  ?Allergen Reactions  ? Lac Bovis   ? ? ? ?Physical examination: ?Blood pressure 120/76, pulse 74, temperature 98.4 ?F (36.9 ?C), resp. rate 18, height 5' 4.17" (1.63 m), weight 172 lb 3.2 oz (78.1 kg), SpO2 97 %. ? ?General: Alert, interactive, in no acute distress. ?HEENT: PERRLA, TMs pearly gray, turbinates non-edematous without discharge, post-pharynx non erythematous. ?Neck: Supple without lymphadenopathy. ?Lungs: Clear to auscultation without wheezing, rhonchi or rales. {no increased work of breathing. ?CV: Normal S1, S2 without murmurs. ?Abdomen: Nondistended, nontender. ?Skin: Warm and dry, without lesions or rashes. ?Extremities:  No clubbing, cyanosis or edema. ?Neuro:   Grossly intact. ? ?Diagnositics/Labs: ? ?Spirometry: FEV1: 2.02L 87%, FVC: 2.59L 89%, ratio consistent with nonbstructive pattern ? ?Assessment and plan: ?Chronic urticaria ?- at this time etiology of hives and swelling is unknown.  Hives can be caused by a variety of different triggers including illness/infection, foods, medications, stings, exercise, pressure, vibrations, extremes of temperature to name a few however majority of the time there is no identifiable trigger.  Your symptoms have been ongoing for >6 weeks making this chronic thus will obtain labwork to evaluate: CBC w diff, CMP, tryptase, hive panel, environmental panel, alpha-gal panel ?- for hive management recommend following high-dose antihistamine regimen: Zyrtec 1 tab twice a day with Pepcid 1 tab twice a day.   Zyrtec is a histamine 1 blocker and Pepcid is a histamine 2 blocker.  ?- if histamine 1/2 blockers are not effective in controlling hives then would add in Singulair, an anti-leukotriene medication ?- if  triple therapy is not effective enough in controlling hives then would advance to Xolair monthly injections for chronic spontaneous hive control.  Will discuss Xolair in more details in future if needed ? ?Allergic rhinitis ?- continue avoidance measures for ragweed, tree pollen and dust mite (these were positive on testing from 2018).  ?- as above will obtain environmental allergy panel ?- continue Zyrtec as above ?- for nasal drainage control recommend use of Astepro, nasal antihistamine, that is OTC.  Use 2 sprays each nostril twice a day as needed for drainage ? ?Food intolerance ?- continue avoidance of dairy/lactose-contain products ? ?Reactive airway ?- let us know if you develop symptoms of cough, wheeze, shortness of breath, chest tightness.  May have symptoms with respiratory illness, worsening allergy symptoms, physical activity, strong odor exposures etc ? ?  Follow-up in 2-3 months or sooner if needed ? ?I appreciate the opportunity to take part in Maureen Franklin's care. Please do not hesitate to contact me with questions. ? ?Sincerely, ? ? ?Prudy Feeler, MD ?Allergy/Immunology ?[facility] ?

## 2022-01-11 LAB — ALPHA-GAL PANEL
Allergen Lamb IgE: <0.1 kU/L
Beef IgE: <0.1 kU/L
IgE (Immunoglobulin E), Serum: 55 IU/mL (ref 6–495)
O215-IgE Alpha-Gal: <0.1 kU/L
Pork IgE: <0.1 kU/L

## 2022-01-11 LAB — COMPREHENSIVE METABOLIC PANEL
ALT: 17 IU/L (ref 0–32)
AST: 26 IU/L (ref 0–40)
Albumin/Globulin Ratio: 1.6 (ref 1.2–2.2)
Albumin: 4.4 g/dL (ref 3.8–4.9)
Alkaline Phosphatase: 68 IU/L (ref 44–121)
BUN/Creatinine Ratio: 17 (ref 9–23)
BUN: 14 mg/dL (ref 6–24)
Bilirubin Total: 0.5 mg/dL (ref 0.0–1.2)
CO2: 24 mmol/L (ref 20–29)
Calcium: 9.9 mg/dL (ref 8.7–10.2)
Chloride: 98 mmol/L (ref 96–106)
Creatinine, Ser: 0.83 mg/dL (ref 0.57–1.00)
Globulin, Total: 2.7 g/dL (ref 1.5–4.5)
Glucose: 93 mg/dL (ref 70–99)
Potassium: 4.5 mmol/L (ref 3.5–5.2)
Sodium: 136 mmol/L (ref 134–144)
Total Protein: 7.1 g/dL (ref 6.0–8.5)
eGFR: 85 mL/min/{1.73_m2} (ref >59)

## 2022-01-11 LAB — ALLERGENS W/TOTAL IGE AREA 2
Alternaria Alternata IgE: <0.1 kU/L
Aspergillus Fumigatus IgE: <0.1 kU/L
Bermuda Grass IgE: <0.1 kU/L
Cat Dander IgE: <0.1 kU/L
Cedar, Mountain IgE: 0.15 kU/L — AB
Cladosporium Herbarum IgE: <0.1 kU/L
Cockroach, German IgE: 0.18 kU/L — AB
Common Silver Birch IgE: <0.1 kU/L
Cottonwood IgE: <0.1 kU/L
D Farinae IgE: 0.28 kU/L — AB
D Pteronyssinus IgE: 0.26 kU/L — AB
Dog Dander IgE: <0.1 kU/L
Elm, American IgE: <0.1 kU/L
Johnson Grass IgE: <0.1 kU/L
Maple/Box Elder IgE: <0.1 kU/L
Mouse Urine IgE: <0.1 kU/L
Oak, White IgE: <0.1 kU/L
Pecan, Hickory IgE: <0.1 kU/L
Penicillium Chrysogen IgE: <0.1 kU/L
Pigweed, Rough IgE: <0.1 kU/L
Ragweed, Short IgE: <0.1 kU/L
Sheep Sorrel IgE Qn: <0.1 kU/L
Timothy Grass IgE: <0.1 kU/L
White Mulberry IgE: <0.1 kU/L

## 2022-01-11 LAB — CBC WITH DIFFERENTIAL
Basophils Absolute: 0.1 10*3/uL (ref 0.0–0.2)
Basos: 2 %
EOS (ABSOLUTE): 0.1 10*3/uL (ref 0.0–0.4)
Eos: 4 %
Hematocrit: 43.3 % (ref 34.0–46.6)
Hemoglobin: 14.5 g/dL (ref 11.1–15.9)
Immature Grans (Abs): 0 10*3/uL (ref 0.0–0.1)
Immature Granulocytes: 0 %
Lymphocytes Absolute: 1.3 10*3/uL (ref 0.7–3.1)
Lymphs: 38 %
MCH: 30.9 pg (ref 26.6–33.0)
MCHC: 33.5 g/dL (ref 31.5–35.7)
MCV: 92 fL (ref 79–97)
Monocytes Absolute: 0.3 10*3/uL (ref 0.1–0.9)
Monocytes: 9 %
Neutrophils Absolute: 1.6 10*3/uL (ref 1.4–7.0)
Neutrophils: 47 %
RBC: 4.69 x10E6/uL (ref 3.77–5.28)
RDW: 11.7 % (ref 11.7–15.4)
WBC: 3.4 10*3/uL (ref 3.4–10.8)

## 2022-01-11 LAB — THYROID ANTIBODIES
Thyroglobulin Antibody: <1 IU/mL (ref 0.0–0.9)
Thyroperoxidase Ab SerPl-aCnc: 17 IU/mL (ref 0–34)

## 2022-01-11 LAB — CHRONIC URTICARIA: cu index: <3.1 (ref <10)

## 2022-01-11 LAB — TRYPTASE: Tryptase: 6.7 ug/L (ref 2.2–13.2)

## 2022-02-28 ENCOUNTER — Encounter: Payer: Self-pay | Admitting: Obstetrics and Gynecology

## 2022-02-28 ENCOUNTER — Ambulatory Visit: Payer: BC Managed Care – PPO | Admitting: Obstetrics and Gynecology

## 2022-02-28 VITALS — BP 110/70 | Ht 64.5 in | Wt 176.0 lb

## 2022-02-28 DIAGNOSIS — Z808 Family history of malignant neoplasm of other organs or systems: Secondary | ICD-10-CM | POA: Diagnosis not present

## 2022-02-28 DIAGNOSIS — N951 Menopausal and female climacteric states: Secondary | ICD-10-CM | POA: Diagnosis not present

## 2022-02-28 DIAGNOSIS — Z801 Family history of malignant neoplasm of trachea, bronchus and lung: Secondary | ICD-10-CM | POA: Diagnosis not present

## 2022-02-28 DIAGNOSIS — Z8042 Family history of malignant neoplasm of prostate: Secondary | ICD-10-CM | POA: Diagnosis not present

## 2022-02-28 DIAGNOSIS — Z1231 Encounter for screening mammogram for malignant neoplasm of breast: Secondary | ICD-10-CM | POA: Diagnosis not present

## 2022-02-28 DIAGNOSIS — Z803 Family history of malignant neoplasm of breast: Secondary | ICD-10-CM | POA: Diagnosis not present

## 2022-02-28 DIAGNOSIS — G4709 Other insomnia: Secondary | ICD-10-CM

## 2022-03-04 DIAGNOSIS — Z1371 Encounter for nonprocreative screening for genetic disease carrier status: Secondary | ICD-10-CM

## 2022-03-04 DIAGNOSIS — Z9189 Other specified personal risk factors, not elsewhere classified: Secondary | ICD-10-CM

## 2022-03-04 HISTORY — DX: Encounter for nonprocreative screening for genetic disease carrier status: Z13.71

## 2022-03-04 HISTORY — DX: Other specified personal risk factors, not elsewhere classified: Z91.89

## 2022-03-23 ENCOUNTER — Ambulatory Visit
Admission: RE | Admit: 2022-03-23 | Discharge: 2022-03-23 | Disposition: A | Discharge status: Home / Self Care | Payer: BC Managed Care – PPO | Source: Ambulatory Visit | Attending: Obstetrics and Gynecology | Admitting: Obstetrics and Gynecology

## 2022-03-23 DIAGNOSIS — Z1231 Encounter for screening mammogram for malignant neoplasm of breast: Secondary | ICD-10-CM | POA: Diagnosis not present

## 2022-03-23 DIAGNOSIS — Z803 Family history of malignant neoplasm of breast: Secondary | ICD-10-CM | POA: Insufficient documentation

## 2022-03-29 ENCOUNTER — Ambulatory Visit: Payer: BC Managed Care – PPO | Admitting: Allergy

## 2022-03-30 ENCOUNTER — Ambulatory Visit: Payer: BC Managed Care – PPO | Admitting: Obstetrics and Gynecology

## 2022-04-04 ENCOUNTER — Encounter: Payer: Self-pay | Admitting: Obstetrics and Gynecology

## 2022-04-20 ENCOUNTER — Ambulatory Visit: Payer: BC Managed Care – PPO | Admitting: Obstetrics and Gynecology

## 2022-04-20 ENCOUNTER — Encounter: Payer: Self-pay | Admitting: Obstetrics and Gynecology

## 2022-04-20 VITALS — BP 122/80 | Ht 64.5 in | Wt 169.0 lb

## 2022-04-20 DIAGNOSIS — G4709 Other insomnia: Secondary | ICD-10-CM | POA: Diagnosis not present

## 2022-04-20 DIAGNOSIS — N921 Excessive and frequent menstruation with irregular cycle: Secondary | ICD-10-CM | POA: Diagnosis not present

## 2022-04-20 DIAGNOSIS — N939 Abnormal uterine and vaginal bleeding, unspecified: Secondary | ICD-10-CM

## 2022-04-20 NOTE — Patient Instructions (Signed)
I value your feedback and you entrusting us with your care. If you get a [facility] patient survey, I would appreciate you taking the time to let us know about your experience today. Thank you! ? ? ?

## 2022-04-20 NOTE — Progress Notes (Signed)
Maureen Sneddon, MD   Chief Complaint  Patient presents with   Menorrhagia    Cramping on Monday was the worst pain in her life. After she vomited cramps stopped.    Diarrhea    HPI:      [name] is a 51 y.o. G3P0030 whose LMP was Patient's last menstrual period was 04/16/2022., presents today for AUB this past month. Menses are Q1-2 months, lasting 7 days, mod flow, no BTB, mild dysmen. No vasomotor sx. Period started 7/23 as expected (had missed prior month), but lasted 3 wks, heavy almost all of it, changing products hourly and with quarter sized clots and wearing night pads. Normal dysmen, improved with NSAIDs. Stopped bleeding for 1 wk then started again about 5 days ago with light bleeding that then turned heavier, changing products Q2 hrs and with clots. Had severe dysmen 3 days ago, worst cramping pain of her life; caused n/v and pt then passed out vs fell asleep and pain was gone. Bleeding has gotten lighter today and dysmen improved.   Hx of AUB 2021 that resolved resolved. Neg pap/neg HPV DNA 2/22  Pt seen 6/23 for insomnia. Sx improved with OTC doxylamine and melatonin. 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    Past Surgical History:  Procedure Laterality Date   BREAST CYST ASPIRATION Right    neg    Family History  Problem Relation Age of Onset   Breast cancer Mother 56   Prostate cancer Father        68,82   Allergic rhinitis Sister    Lung cancer Maternal Grandmother 70   Pancreatic cancer Maternal Uncle 45   Colon cancer Maternal Uncle 73   Breast cancer Paternal Aunt 75   Breast cancer Maternal Aunt        not sure of age   Angioedema Neg Hx    Asthma Neg Hx    Eczema Neg Hx     Immunodeficiency Neg Hx    Urticaria Neg Hx     Social History   Socioeconomic History   Marital status: Married    Spouse name: Not on file   Number of children: Not on file   Years of education: Not on file   Highest education level: Not on file  Occupational History   Not on file  Tobacco Use   Smoking status: Never   Smokeless tobacco: Never  Vaping Use   Vaping Use: Never used  Substance and Sexual Activity   Alcohol use: No   Drug use: No   Sexual activity: Yes    Birth control/protection: None  Other Topics Concern   Not on file  Social History Narrative   Not on file   Social Determinants of Health   Financial Resource Strain: Not on file  Food Insecurity: Not on file  Transportation Needs: Not on file  Physical Activity: Not on file  Stress: Not on file  Social Connections: Not on file  Intimate Partner Violence: Not on file    Outpatient Medications Prior to Visit  Medication Sig Dispense Refill   azelastine (ASTELIN) 0.1 % nasal spray Place 2 sprays into both nostrils 2 (two) times daily. Use in each nostril as directed 30 mL 5  baclofen (LIORESAL) 10 MG tablet Take 5-10 mg by mouth 3 (three) times daily as needed.     cetirizine (ZYRTEC ALLERGY) 10 MG tablet Take 1 tablet (10 mg total) by mouth 2 (two) times daily. 60 tablet 5   Cholecalciferol 1.25 MG (50000 UT) TABS Take by mouth.     MAGNESIUM PO Take by mouth.     riboflavin (VITAMIN B-2) 100 MG TABS tablet Take by mouth.     zolmitriptan (ZOMIG) 5 MG tablet Take by mouth.     famotidine (PEPCID) 20 MG tablet Take 1 tablet (20 mg total) by mouth 2 (two) times daily. (Patient not taking: Reported on 04/20/2022) 60 tablet 5   No facility-administered medications prior to visit.      ROS:  Review of Systems  Constitutional:  Negative for fever.  Gastrointestinal:  Negative for blood in stool, constipation, diarrhea, nausea and vomiting.  Genitourinary:  Positive for menstrual problem. Negative  for dyspareunia, dysuria, flank pain, frequency, hematuria, urgency, vaginal bleeding, vaginal discharge and vaginal pain.  Musculoskeletal:  Negative for back pain.  Skin:  Negative for rash.   BREAST: No symptoms   OBJECTIVE:   Vitals:  BP 122/80   Ht 5' 4.5" (1.638 m)   Wt 169 lb (76.7 kg)   LMP 04/16/2022   BMI 28.56 kg/m   Physical Exam Vitals reviewed.  Constitutional:      Appearance: She is well-developed.  Pulmonary:     Effort: Pulmonary effort is normal.  Genitourinary:    General: Normal vulva.     Pubic Area: No rash.      Labia:        Right: No rash, tenderness or lesion.        Left: No rash, tenderness or lesion.      Vagina: Bleeding present. No vaginal discharge, erythema or tenderness.     Cervix: Normal.     Uterus: Normal. Not enlarged and not tender.      Adnexa: Right adnexa normal and left adnexa normal.       Right: No mass or tenderness.         Left: No mass or tenderness.    Musculoskeletal:        General: Normal range of motion.     Cervical back: Normal range of motion.  Skin:    General: Skin is warm and dry.  Neurological:     General: No focal deficit present.     Mental Status: She is alert and oriented to person, place, and time.  Psychiatric:        Mood and Affect: Mood normal.        Behavior: Behavior normal.        Thought Content: Thought content normal.        Judgment: Judgment normal.     Assessment/Plan: Abnormal uterine bleeding (AUB) - Plan: US PELVIS TRANSVAGINAL NON-OB (TV ONLY); since 7/23. Check GYN u/s. Period today was when menses normally do. F/u if sx get heavy again vs bleeding persists and will try Rx aygestin prn. Will f/u with u/s results. May benefit from hormones vs IUD prn.   Menorrhagia with irregular cycle - Plan: US PELVIS TRANSVAGINAL NON-OB (TV ONLY)  Other insomnia--sx improved with OTC med tx.     Return in about 2 weeks (around 05/04/2022) for GYn u/s for AUB/menorrhagia--[facility] to call  pt.  Zyshawn Bohnenkamp B. Rhodie Cienfuegos, PA-C 04/20/2022 2:01 PM

## 2022-04-24 DIAGNOSIS — R946 Abnormal results of thyroid function studies: Secondary | ICD-10-CM | POA: Diagnosis not present

## 2022-04-24 DIAGNOSIS — Z0000 Encounter for general adult medical examination without abnormal findings: Secondary | ICD-10-CM | POA: Diagnosis not present

## 2022-04-24 DIAGNOSIS — Z6828 Body mass index (BMI) 28.0-28.9, adult: Secondary | ICD-10-CM | POA: Diagnosis not present

## 2022-04-24 DIAGNOSIS — Z23 Encounter for immunization: Secondary | ICD-10-CM | POA: Diagnosis not present

## 2022-04-24 DIAGNOSIS — Z13220 Encounter for screening for lipoid disorders: Secondary | ICD-10-CM | POA: Diagnosis not present

## 2022-04-24 DIAGNOSIS — Z131 Encounter for screening for diabetes mellitus: Secondary | ICD-10-CM | POA: Diagnosis not present

## 2022-05-11 ENCOUNTER — Ambulatory Visit (INDEPENDENT_AMBULATORY_CARE_PROVIDER_SITE_OTHER): Payer: BC Managed Care – PPO

## 2022-05-11 DIAGNOSIS — N939 Abnormal uterine and vaginal bleeding, unspecified: Secondary | ICD-10-CM | POA: Diagnosis not present

## 2022-05-11 DIAGNOSIS — N921 Excessive and frequent menstruation with irregular cycle: Secondary | ICD-10-CM | POA: Diagnosis not present

## 2022-06-05 ENCOUNTER — Telehealth: Payer: Self-pay | Admitting: Obstetrics and Gynecology

## 2022-06-05 NOTE — Telephone Encounter (Signed)
Pt aware of neg MyRisk results. IBIS=35.7%/riskscore=29.2%. Pt aware of recommendations of monthly SBE, yearly CBE and mammo (done 7/23) and yearly breast MRI (pt to call for ref if desires). Also discussed chemoprevention and prophylactic mastectomy.   Patient understands these results only apply to her and her children, and this is not indicative of genetic testing results of her other family members. It is recommended that her other family members have genetic testing done.  Pt also understands negative genetic testing doesn't mean she will never get any of these cancers.   Hard copy mailed to pt. F/u prn.

## 2022-07-18 DIAGNOSIS — Z23 Encounter for immunization: Secondary | ICD-10-CM | POA: Diagnosis not present

## 2022-08-11 DIAGNOSIS — S63502A Unspecified sprain of left wrist, initial encounter: Secondary | ICD-10-CM | POA: Diagnosis not present

## 2022-08-11 DIAGNOSIS — S93602A Unspecified sprain of left foot, initial encounter: Secondary | ICD-10-CM | POA: Diagnosis not present

## 2022-08-22 DIAGNOSIS — S63502A Unspecified sprain of left wrist, initial encounter: Secondary | ICD-10-CM | POA: Diagnosis not present

## 2022-08-22 DIAGNOSIS — S93602A Unspecified sprain of left foot, initial encounter: Secondary | ICD-10-CM | POA: Diagnosis not present

## 2022-11-29 DIAGNOSIS — G43009 Migraine without aura, not intractable, without status migrainosus: Secondary | ICD-10-CM | POA: Diagnosis not present

## 2022-11-29 DIAGNOSIS — Z1330 Encounter for screening examination for mental health and behavioral disorders, unspecified: Secondary | ICD-10-CM | POA: Diagnosis not present

## 2023-01-13 ENCOUNTER — Other Ambulatory Visit: Payer: Self-pay | Admitting: Allergy

## 2023-04-18 ENCOUNTER — Ambulatory Visit (HOSPITAL_COMMUNITY)
Admission: EM | Admit: 2023-04-18 | Discharge: 2023-04-18 | Disposition: A | Discharge status: Home / Self Care | Payer: BC Managed Care – PPO | Attending: Emergency Medicine | Admitting: Emergency Medicine

## 2023-04-18 ENCOUNTER — Encounter (HOSPITAL_COMMUNITY): Payer: Self-pay

## 2023-04-18 DIAGNOSIS — G43901 Migraine, unspecified, not intractable, with status migrainosus: Secondary | ICD-10-CM

## 2023-04-18 MED ORDER — METOCLOPRAMIDE HCL 5 MG/ML IJ SOLN
INTRAMUSCULAR | Status: AC
  Filled 2023-04-18: qty 2

## 2023-04-18 MED ORDER — METOCLOPRAMIDE HCL 5 MG/ML IJ SOLN
5.0000 mg | Freq: Once | INTRAMUSCULAR | Status: AC
  Administered 2023-04-18: 5 mg via INTRAMUSCULAR

## 2023-04-18 MED ORDER — KETOROLAC TROMETHAMINE 30 MG/ML IJ SOLN
30.0000 mg | Freq: Once | INTRAMUSCULAR | Status: AC
  Administered 2023-04-18: 30 mg via INTRAMUSCULAR

## 2023-04-18 MED ORDER — DEXAMETHASONE SODIUM PHOSPHATE 10 MG/ML IJ SOLN
INTRAMUSCULAR | Status: AC
  Filled 2023-04-18: qty 1

## 2023-04-18 MED ORDER — DEXAMETHASONE SODIUM PHOSPHATE 10 MG/ML IJ SOLN
10.0000 mg | Freq: Once | INTRAMUSCULAR | Status: AC
  Administered 2023-04-18: 10 mg via INTRAMUSCULAR

## 2023-04-18 MED ORDER — KETOROLAC TROMETHAMINE 30 MG/ML IJ SOLN
INTRAMUSCULAR | Status: AC
  Filled 2023-04-18: qty 1

## 2023-04-18 NOTE — Discharge Instructions (Signed)
We have given you IM migraine cocktail here in clinic today to help with your headache.  Please go home and rest, decreased your stimulation, reduce the lights and ensure you are staying well-hydrated by drinking plenty of fluid.  If you find that your headache does not improve despite the cocktail, or you develop any new concerning symptoms like one-sided weakness, vision changes, fever, or any new symptoms please seek immediate evaluation at the nearest emergency department.

## 2023-04-18 NOTE — ED Provider Notes (Signed)
[facility]    CSN: [id number] Arrival date & time: 04/18/23  1706      History   Chief Complaint Chief Complaint  Patient presents with   Migraine    HPI Maureen Franklin is a 52 y.o. female.   Patient presents to clinic for a migraine headache that has been ongoing for the past 6 days. Pain has been in her neck, reports pain with head movement. She has tried baclofen, sumatriptan and yoga. She has been having migraines since she was 52 years old. Sees neurology.   Reports nausea, emesis on Saturday. Endorses photophobia.   Denies fevers. Denies body aches, cough, sore throat or chest pains.  Has not been sleeping well due to pain.     The history is provided by the patient and medical records.  Migraine Associated symptoms include headaches. Pertinent negatives include no chest pain.    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 Authorizing Provider  azelastine (ASTELIN) 0.1 % nasal spray Place 2 sprays into both nostrils 2 (two) times daily. Use in each nostril as directed 12/30/21    Marcelyn Bruins, MD  baclofen (LIORESAL) 10 MG tablet Take 5-10 mg by mouth 3 (three) times daily as needed. 09/23/21   [provider]  cetirizine (ZYRTEC ALLERGY) 10 MG tablet Take 1 tablet (10 mg total) by mouth 2 (two) times daily. 12/30/21   Marcelyn Bruins, MD  Cholecalciferol 1.25 MG (50000 UT) TABS Take by mouth.    [provider]  famotidine (PEPCID) 20 MG tablet Take 1 tablet (20 mg total) by mouth 2 (two) times daily. Patient not taking: Reported on 04/20/2022 12/30/21   Marcelyn Bruins, MD  MAGNESIUM PO Take by mouth.    [provider]  riboflavin (VITAMIN B-2) 100 MG TABS tablet Take by mouth.    [provider]  zolmitriptan (ZOMIG) 5 MG tablet Take by mouth. 02/15/22   [provider]    Family History Family History  Problem Relation Age of Onset   Breast cancer Mother 58   Prostate cancer Father        65,82   Allergic rhinitis Sister    Lung cancer Maternal Grandmother 11   Pancreatic cancer Maternal  Uncle 45   Colon cancer Maternal Uncle 70   Breast cancer Paternal Aunt 52   Breast cancer Maternal Aunt        not sure of age   Angioedema Neg Hx    Asthma Neg Hx    Eczema Neg Hx    Immunodeficiency Neg Hx    Urticaria Neg Hx     Social History Social History   Tobacco Use   Smoking status: Never   Smokeless tobacco: Never  Vaping Use   Vaping status: Never Used  Substance Use Topics   Alcohol use: No   Drug use: No     Allergies   Milk (cow)   Review of Systems Review of Systems  Constitutional:  Negative for chills.  HENT:  Negative for congestion and sore throat.   Respiratory:  Negative for cough.   Cardiovascular:  Negative for chest pain.  Neurological:  Positive for headaches. Negative for facial asymmetry, speech difficulty, weakness and numbness.     Physical Exam Triage Vital Signs ED Triage Vitals  Encounter Vitals Group     BP 04/18/23 1825 110/74      Systolic BP Percentile --      Diastolic BP Percentile --      Pulse Rate 04/18/23 1825 72     Resp 04/18/23 1825 18     Temp 04/18/23 1825 98.6 F (37 C)     Temp src --      SpO2 04/18/23 1825 97 %     Weight --      Height --      Head Circumference --      Peak Flow --      Pain Score 04/18/23 1824 6     Pain Loc --      Pain Education --      Exclude from Growth Chart --    No data found.  Updated Vital Signs BP 110/74   Pulse 72   Temp 98.6 F (37 C)   Resp 18   LMP 03/18/2023   SpO2 97%   Visual Acuity Right Eye Distance:   Left Eye Distance:   Bilateral Distance:    Right Eye Near:   Left Eye Near:    Bilateral Near:     Physical Exam Vitals and nursing note reviewed.  Constitutional:      Appearance: Normal appearance.  HENT:     Head: Normocephalic and atraumatic.     Right Ear: External ear normal.     Left Ear: External ear normal.     Nose: Nose normal.     Mouth/Throat:     Mouth: Mucous membranes are moist.  Eyes:     Extraocular Movements: Extraocular movements intact.     Conjunctiva/sclera: Conjunctivae normal.     Pupils: Pupils are equal, round, and reactive to light.  Cardiovascular:     Rate and Rhythm: Normal rate.  Pulmonary:     Effort: Pulmonary effort is normal. No respiratory distress.  Musculoskeletal:        General: Normal range of motion.     Cervical back: Normal range of motion. No rigidity.  Skin:    General: Skin is warm and dry.  Neurological:     General: No focal deficit present.     Mental Status: She is alert and oriented to person, place, and time.  Psychiatric:        Mood and Affect: Mood normal.        Behavior:  Behavior normal.      UC Treatments / Results  Labs (all labs ordered are listed, but only abnormal results are displayed) Labs Reviewed - No data to display  EKG   Radiology No results found.  Procedures Procedures (including critical care time)  Medications Ordered in  UC Medications  ketorolac (TORADOL) 30 MG/ML injection 30 mg (has no administration in time range)  metoCLOPramide (REGLAN) injection 5 mg (has no administration in time range)  dexamethasone (DECADRON) injection 10 mg (has no administration in time range)    Initial Impression / Assessment and Plan / UC Course  I have reviewed the triage vital signs and the nursing notes.  Pertinent labs & imaging results that were available during my care of the patient were reviewed by me and considered in my medical decision making (see chart for details).  Vitals and triage reviewed, patient is hemodynamically stable.  GCS 15, PERRLA, EOMI. CN 2-12 grossly intact.  Does have musculoskeletal neck tenderness, increased with range of motion.  Negative for neck rigidity or fevers.  Will trial migraine cocktail.  Strict emergency precautions given if symptoms persist despite cocktail or any changes.  Plan of care, follow-up care and return precautions given, no questions at this time.     Final Clinical Impressions(s) / UC Diagnoses   Final diagnoses:  Migraine with status migrainosus, not intractable, unspecified migraine type     Discharge Instructions      We have given you IM migraine cocktail here in clinic today to help with your headache.  Please go home and rest, decreased your stimulation, reduce the lights and ensure you are staying well-hydrated by drinking plenty of fluid.  If you find that your headache does not improve despite the cocktail, or you develop any new concerning symptoms like one-sided weakness, vision changes, fever, or any new symptoms please seek immediate evaluation at the nearest emergency department.     ED Prescriptions   None    PDMP not reviewed this encounter.   Ulas Zuercher, [place], [place] 04/18/23 1850

## 2023-04-18 NOTE — Progress Notes (Unsigned)
PCP: Woodroe Chen, MD   No chief complaint on file.   HPI:      [name] is a 52 y.o. G3P0030 whose LMP was Patient's last menstrual period was 03/18/2023., presents today for her annual examination.  Her menses are {norm/abn:715}, lasting {number: 22536} days.  Dysmenorrhea {dysmen:716}. She {does:18564} have intermenstrual bleeding. She {does:18564} have vasomotor sx. AUB 8/23  Sex activity: {sex active: 315163}. She {does:18564} have vaginal dryness.  Last Pap: 10/18/20 Results were: no abnormalities /neg HPV DNA.  Hx of STDs: {STD hx:14358}  Last mammogram: 03/23/22 Results were: normal--routine follow-up in 12 months There is no FH of breast cancer. There is no FH of ovarian cancer. The patient {does:18564} do self-breast exams. Pt is MyRisk neg ****  Colonoscopy: {hx:15363}  Repeat due after 10*** years.   Tobacco use: {tob:20664} Alcohol use: {Alcohol:11675} No drug use Exercise: {exercise:31265}  She {does:18564} get adequate calcium and Vitamin D in her diet.  Labs with PCP.   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    Past Surgical History:  Procedure Laterality Date   BREAST CYST ASPIRATION Right    neg    Family History  Problem Relation Age of Onset   Breast cancer Mother 37   Prostate cancer Father        73,82   Allergic rhinitis Sister    Lung cancer Maternal Grandmother 65   Pancreatic cancer Maternal Uncle 45   Colon cancer Maternal Uncle 40   Breast cancer Paternal Aunt 33   Breast cancer Maternal Aunt        not sure of age   Angioedema Neg Hx    Asthma Neg Hx    Eczema Neg Hx    Immunodeficiency Neg Hx    Urticaria Neg Hx     Social History   Socioeconomic History   Marital status:  Married    Spouse name: Not on file   Number of children: Not on file   Years of education: Not on file   Highest education level: Not on file  Occupational History   Not on file  Tobacco Use   Smoking status: Never   Smokeless tobacco: Never  Vaping Use   Vaping status: Never Used  Substance and Sexual Activity   Alcohol use: No   Drug use: No   Sexual activity: Yes    Birth control/protection: None  Other Topics Concern   Not on file  Social History Narrative   Not on file   Social Determinants of Health   Financial Resource Strain: Low Risk  (11/29/2022)   Received from [facility], [facility]   Overall Financial Resource Strain (CARDIA)    Difficulty of Paying Living Expenses: Not hard at all  Food Insecurity: No Food Insecurity (11/29/2022)   Received from [facility], [facility]   Hunger Vital Sign    Worried About Running Out of Food in the Last Year: Never true    Ran Out of Food in the Last Year: Never true  Transportation Needs: No Transportation Needs (11/29/2022)   Received from [facility], [facility]   PRAPARE - Transportation    Lack of Transportation (Medical): No    Lack of Transportation (Non-Medical): No  Physical Activity: Inactive (09/23/2021)  Received from [facility], [facility]   Exercise Vital Sign    Days of Exercise per Week: 0 days    Minutes of Exercise per Session: 0 min  Stress: No Stress Concern Present (09/23/2021)   Received from [facility], [facility] - Occupational Stress Questionnaire    Feeling of Stress : Not at all  Social Connections: Socially Integrated (04/21/2022)   Received from [facility], [facility]   Social Network    How would you rate your social network (family, work, friends)?: Good participation with social networks  Intimate Partner Violence: Not At Risk (04/21/2022)   Received from [facility], [facility]   HITS    Over the last 12  months how often did your partner physically hurt you?: 1    Over the last 12 months how often did your partner insult you or talk down to you?: 1    Over the last 12 months how often did your partner threaten you with physical harm?: 1    Over the last 12 months how often did your partner scream or curse at you?: 1     Current Outpatient Medications:    azelastine (ASTELIN) 0.1 % nasal spray, Place 2 sprays into both nostrils 2 (two) times daily. Use in each nostril as directed, Disp: 30 mL, Rfl: 5   baclofen (LIORESAL) 10 MG tablet, Take 5-10 mg by mouth 3 (three) times daily as needed., Disp: , Rfl:    cetirizine (ZYRTEC ALLERGY) 10 MG tablet, Take 1 tablet (10 mg total) by mouth 2 (two) times daily., Disp: 60 tablet, Rfl: 5   Cholecalciferol 1.25 MG (50000 UT) TABS, Take by mouth., Disp: , Rfl:    famotidine (PEPCID) 20 MG tablet, Take 1 tablet (20 mg total) by mouth 2 (two) times daily. (Patient not taking: Reported on 04/20/2022), Disp: 60 tablet, Rfl: 5   MAGNESIUM PO, Take by mouth., Disp: , Rfl:    riboflavin (VITAMIN B-2) 100 MG TABS tablet, Take by mouth., Disp: , Rfl:    zolmitriptan (ZOMIG) 5 MG tablet, Take by mouth., Disp: , Rfl:      ROS:  Review of Systems BREAST: No symptoms    Objective: LMP 03/18/2023    OBGyn Exam  Results: No results found for this or any previous visit (from the past 24 hour(s)).  Assessment/Plan:  No diagnosis found.   No orders of the defined types were placed in this encounter.           GYN counsel {counseling: 16159}    F/U  No follow-ups on file.  Keisy Strickler B. Galia Rahm, PA-C 04/18/2023 7:33 PM

## 2023-04-18 NOTE — ED Triage Notes (Signed)
Pt presents with complaints of migraine x 6 days with no relief with medication. Pt endorses headache, fatigue, and nausea. Pain is sitting in her neck and her head. Neck pain is not normal with her migraines.

## 2023-04-19 ENCOUNTER — Ambulatory Visit (INDEPENDENT_AMBULATORY_CARE_PROVIDER_SITE_OTHER): Payer: BC Managed Care – PPO | Admitting: Obstetrics and Gynecology

## 2023-04-19 ENCOUNTER — Encounter: Payer: Self-pay | Admitting: Obstetrics and Gynecology

## 2023-04-19 VITALS — BP 102/63 | HR 69 | Ht 64.0 in | Wt 166.0 lb

## 2023-04-19 DIAGNOSIS — G43001 Migraine without aura, not intractable, with status migrainosus: Secondary | ICD-10-CM

## 2023-04-19 DIAGNOSIS — N951 Menopausal and female climacteric states: Secondary | ICD-10-CM

## 2023-04-19 DIAGNOSIS — Z1231 Encounter for screening mammogram for malignant neoplasm of breast: Secondary | ICD-10-CM

## 2023-04-19 DIAGNOSIS — Z01419 Encounter for gynecological examination (general) (routine) without abnormal findings: Secondary | ICD-10-CM | POA: Diagnosis not present

## 2023-04-19 DIAGNOSIS — Z9189 Other specified personal risk factors, not elsewhere classified: Secondary | ICD-10-CM

## 2023-04-19 DIAGNOSIS — Z1211 Encounter for screening for malignant neoplasm of colon: Secondary | ICD-10-CM

## 2023-04-19 DIAGNOSIS — Z803 Family history of malignant neoplasm of breast: Secondary | ICD-10-CM

## 2023-04-19 NOTE — Patient Instructions (Signed)
I value your feedback and you entrusting us with your care. If you get a [facility] patient survey, I would appreciate you taking the time to let us know about your experience today. Thank you!  [facility] ([place])--[phone]  

## 2023-05-02 ENCOUNTER — Emergency Department (HOSPITAL_COMMUNITY): Payer: BC Managed Care – PPO

## 2023-05-02 ENCOUNTER — Other Ambulatory Visit: Payer: Self-pay

## 2023-05-02 ENCOUNTER — Emergency Department (HOSPITAL_COMMUNITY)
Admission: EM | Admit: 2023-05-02 | Discharge: 2023-05-02 | Disposition: A | Discharge status: Home / Self Care | Payer: BC Managed Care – PPO | Attending: Emergency Medicine | Admitting: Emergency Medicine

## 2023-05-02 DIAGNOSIS — R519 Headache, unspecified: Secondary | ICD-10-CM | POA: Diagnosis not present

## 2023-05-02 DIAGNOSIS — G43101 Migraine with aura, not intractable, with status migrainosus: Secondary | ICD-10-CM | POA: Insufficient documentation

## 2023-05-02 DIAGNOSIS — G43909 Migraine, unspecified, not intractable, without status migrainosus: Secondary | ICD-10-CM | POA: Diagnosis not present

## 2023-05-02 DIAGNOSIS — G43109 Migraine with aura, not intractable, without status migrainosus: Secondary | ICD-10-CM | POA: Diagnosis not present

## 2023-05-02 LAB — BASIC METABOLIC PANEL
Anion gap: 10 (ref 5–15)
BUN: 12 mg/dL (ref 6–20)
CO2: 23 mmol/L (ref 22–32)
Calcium: 8.3 mg/dL — ABNORMAL LOW (ref 8.9–10.3)
Chloride: 102 mmol/L (ref 98–111)
Creatinine, Ser: 0.9 mg/dL (ref 0.44–1.00)
GFR, Estimated: >60 mL/min (ref >60)
Glucose, Bld: 109 mg/dL — ABNORMAL HIGH (ref 70–99)
Potassium: 4.6 mmol/L (ref 3.5–5.1)
Sodium: 135 mmol/L (ref 135–145)

## 2023-05-02 LAB — CBC
HCT: 42.7 % (ref 36.0–46.0)
Hemoglobin: 14 g/dL (ref 12.0–15.0)
MCH: 31.6 pg (ref 26.0–34.0)
MCHC: 32.8 g/dL (ref 30.0–36.0)
MCV: 96.4 fL (ref 80.0–100.0)
Platelets: 248 10*3/uL (ref 150–400)
RBC: 4.43 MIL/uL (ref 3.87–5.11)
RDW: 11.9 % (ref 11.5–15.5)
WBC: 4.6 10*3/uL (ref 4.0–10.5)
nRBC: 0 % (ref 0.0–0.2)

## 2023-05-02 MED ORDER — DEXAMETHASONE SODIUM PHOSPHATE 10 MG/ML IJ SOLN
10.0000 mg | Freq: Once | INTRAMUSCULAR | Status: AC
  Administered 2023-05-02: 10 mg via INTRAVENOUS
  Filled 2023-05-02: qty 1

## 2023-05-02 MED ORDER — ONDANSETRON 4 MG PO TBDP: 4.0000 mg | ORAL_TABLET | Freq: Three times a day (TID) | ORAL | 0 refills | Status: AC | PRN

## 2023-05-02 MED ORDER — SODIUM CHLORIDE 0.9 % IV BOLUS
1000.0000 mL | Freq: Once | INTRAVENOUS | Status: AC
  Administered 2023-05-02: 1000 mL via INTRAVENOUS

## 2023-05-02 MED ORDER — GADOBUTROL 1 MMOL/ML IV SOLN
6.0000 mL | Freq: Once | INTRAVENOUS | Status: AC | PRN
  Administered 2023-05-02: 6 mL via INTRAVENOUS

## 2023-05-02 MED ORDER — DIPHENHYDRAMINE HCL 50 MG/ML IJ SOLN
12.5000 mg | Freq: Once | INTRAMUSCULAR | Status: AC
  Administered 2023-05-02: 12.5 mg via INTRAVENOUS
  Filled 2023-05-02: qty 1

## 2023-05-02 MED ORDER — SODIUM CHLORIDE 0.9 % IV SOLN: INTRAVENOUS | Status: DC

## 2023-05-02 MED ORDER — MORPHINE SULFATE (PF) 4 MG/ML IV SOLN
4.0000 mg | Freq: Once | INTRAVENOUS | Status: AC
  Administered 2023-05-02: 4 mg via INTRAVENOUS
  Filled 2023-05-02: qty 1

## 2023-05-02 MED ORDER — KETOROLAC TROMETHAMINE 15 MG/ML IJ SOLN
15.0000 mg | Freq: Once | INTRAMUSCULAR | Status: AC
  Administered 2023-05-02: 15 mg via INTRAVENOUS
  Filled 2023-05-02: qty 1

## 2023-05-02 MED ORDER — METOCLOPRAMIDE HCL 5 MG/ML IJ SOLN
10.0000 mg | Freq: Once | INTRAMUSCULAR | Status: AC
  Administered 2023-05-02: 10 mg via INTRAVENOUS
  Filled 2023-05-02: qty 2

## 2023-05-02 MED ORDER — OXYCODONE-ACETAMINOPHEN 5-325 MG PO TABS: 1.0000 | ORAL_TABLET | Freq: Four times a day (QID) | ORAL | 0 refills | Status: AC | PRN

## 2023-05-02 NOTE — ED Provider Notes (Signed)
[place] EMERGENCY DEPARTMENT AT [facility] Provider Note   CSN: [id number] Arrival date & time: 05/02/23  0945     History  Chief Complaint  Patient presents with   Migraine    Maureen Franklin is a 52 y.o. female.  Pt is a 52 yo female with pmhx significant for migraines.  Pt said she's had migraines since she was 8.  Pt said she's had a migraine for several days.  She went to [facility] on 8/14. They gave her a "migraine cocktail" which helped, but did not relieve sx completely.  She still has a terrible pain in her head and had some n/v today, so she decided to come in.  She does have a neurologist and has tried to make an appt, but has not had that appt yet.  No neuro sx other than h/a.       Home Medications Prior to Admission medications   Medication Sig Start Date End Date Taking? Authorizing Provider  ondansetron (ZOFRAN-ODT) 4 MG disintegrating tablet Take 1 tablet (4 mg total) by mouth every 8 (eight) hours as needed. 05/02/23  Yes Jacalyn Lefevre, MD  oxyCODONE-acetaminophen (PERCOCET/ROXICET) 5-325 MG tablet Take 1 tablet by mouth every 6 (six) hours as needed for severe pain. 05/02/23  Yes Jacalyn Lefevre, MD  azelastine (ASTELIN) 0.1 % nasal spray Place 2 sprays into both nostrils 2 (two) times daily. Use in each nostril as directed 12/30/21   Marcelyn Bruins, MD  baclofen (LIORESAL) 10 MG tablet Take 5-10 mg by mouth 3 (three) times daily as needed. 09/23/21   [provider]  cetirizine (ZYRTEC ALLERGY) 10 MG tablet Take 1 tablet (10 mg total) by mouth 2 (two) times daily. 12/30/21   Marcelyn Bruins, MD  Cholecalciferol 1.25 MG (50000 UT) TABS Take by mouth.    [provider]  famotidine (PEPCID) 20 MG tablet Take 1 tablet (20 mg total) by mouth 2 (two) times daily. 12/30/21   Marcelyn Bruins, MD  MAGNESIUM PO Take by mouth.    [provider]  riboflavin (VITAMIN B-2) 100 MG TABS tablet Take by mouth.     [provider]  zolmitriptan (ZOMIG) 5 MG tablet Take by mouth. 11/17/22   [provider]      Allergies    Milk (cow) and Lactose intolerance (gi)    Review of Systems   Review of Systems  All other systems reviewed and are negative.   Physical Exam Updated Vital Signs BP 113/67   Pulse 83   Temp 98.1 F (36.7 C) (Oral)   Resp 17   LMP 03/18/2023   SpO2 100%  Physical Exam Vitals and nursing note reviewed.  Constitutional:      Appearance: Normal appearance.  HENT:     Head: Normocephalic and atraumatic.     Right Ear: External ear normal.     Left Ear: External ear normal.     Nose: Nose normal.     Mouth/Throat:     Mouth: Mucous membranes are dry.  Eyes:     Extraocular Movements: Extraocular movements intact.     Conjunctiva/sclera: Conjunctivae normal.     Pupils: Pupils are equal, round, and reactive to light.  Cardiovascular:     Rate and Rhythm: Normal rate and regular rhythm.     Pulses: Normal pulses.     Heart sounds: Normal heart sounds.  Pulmonary:     Effort: Pulmonary effort is normal.     Breath  sounds: Normal breath sounds.  Abdominal:     General: Abdomen is flat. Bowel sounds are normal.     Palpations: Abdomen is soft.  Musculoskeletal:        General: Normal range of motion.     Cervical back: Normal range of motion.  Skin:    General: Skin is warm.     Capillary Refill: Capillary refill takes less than 2 seconds.  Neurological:     General: No focal deficit present.     Mental Status: She is alert and oriented to person, place, and time.  Psychiatric:        Mood and Affect: Mood normal.        Behavior: Behavior normal.     ED Results / Procedures / Treatments   Labs (all labs ordered are listed, but only abnormal results are displayed) Labs Reviewed  BASIC METABOLIC PANEL - Abnormal; Notable for the following components:      Result Value   Glucose, Bld 109 (*)    Calcium 8.3 (*)    All other components  within normal limits  CBC  HCG, SERUM, QUALITATIVE    EKG None  Radiology MR BRAIN W WO CONTRAST  Result Date: 05/02/2023 CLINICAL DATA:  Headache, increasing frequency or severity. EXAM: MRI HEAD WITHOUT AND WITH CONTRAST TECHNIQUE: Multiplanar, multiecho pulse sequences of the brain and surrounding structures were obtained without and with intravenous contrast. CONTRAST:  6mL GADAVIST GADOBUTROL 1 MMOL/ML IV SOLN COMPARISON:  Head CT 05/02/2023. FINDINGS: Brain: No acute infarct or hemorrhage. No hydrocephalus or extra-axial collection. No foci of abnormal susceptibility. No mass or abnormal enhancement. Vascular: Normal flow voids and vessel enhancement. Skull and upper cervical spine: Normal marrow signal and enhancement. Sinuses/Orbits: No acute findings. Other: None. IMPRESSION: Normal brain MRI. Electronically Signed   By: Orvan Falconer M.D.   On: 05/02/2023 14:39   CT Head Wo Contrast  Result Date: 05/02/2023 CLINICAL DATA:  Migraine, severe headache since August 9th EXAM: CT HEAD WITHOUT CONTRAST TECHNIQUE: Contiguous axial images were obtained from the base of the skull through the vertex without intravenous contrast. RADIATION DOSE REDUCTION: This exam was performed according to the departmental dose-optimization program which includes automated exposure control, adjustment of the mA and/or kV according to patient size and/or use of iterative reconstruction technique. COMPARISON:  None Available. FINDINGS: Brain: No evidence of acute infarction, hemorrhage, hydrocephalus, extra-axial collection or mass lesion/mass effect. Vascular: No hyperdense vessel or unexpected calcification. Skull: Normal. Negative for fracture or focal lesion. Sinuses/Orbits: No acute finding. Other: None. IMPRESSION: No acute intracranial pathology. Electronically Signed   By: Jearld Lesch M.D.   On: 05/02/2023 10:58    Procedures Procedures    Medications Ordered in ED Medications  sodium chloride 0.9 %  bolus 1,000 mL (1,000 mLs Intravenous New Bag/Given 05/02/23 1035)    And  0.9 %  sodium chloride infusion (has no administration in time range)  ketorolac (TORADOL) 15 MG/ML injection 15 mg (15 mg Intravenous Given 05/02/23 1034)  metoCLOPramide (REGLAN) injection 10 mg (10 mg Intravenous Given 05/02/23 1034)  diphenhydrAMINE (BENADRYL) injection 12.5 mg (12.5 mg Intravenous Given 05/02/23 1034)  dexamethasone (DECADRON) injection 10 mg (10 mg Intravenous Given 05/02/23 1034)  morphine (PF) 4 MG/ML injection 4 mg (4 mg Intravenous Given 05/02/23 1457)  gadobutrol (GADAVIST) 1 MMOL/ML injection 6 mL (6 mLs Intravenous Contrast Given 05/02/23 1330)    ED Course/ Medical Decision Making/ A&P  Medical Decision Making Amount and/or Complexity of Data Reviewed Labs: ordered. Radiology: ordered.  Risk Prescription drug management.   This patient presents to the ED for concern of headache, this involves an extensive number of treatment options, and is a complaint that carries with it a high risk of complications and morbidity.  The differential diagnosis includes migraine, cva, head bleed   Co morbidities that complicate the patient evaluation  migraines   Additional history obtained:  Additional history obtained from epic chart review External records from outside source obtained and reviewed including sister   Lab Tests:  I Ordered, and personally interpreted labs.  The pertinent results include:  cbc nl, bmp nl   Imaging Studies ordered:  I ordered imaging studies including ct head/mri brain  I independently visualized and interpreted imaging which showed  CT head: No acute intracranial pathology.  MRI brain: Normal brain MRI.  I agree with the radiologist interpretation   Cardiac Monitoring:  The patient was maintained on a cardiac monitor.  I personally viewed and interpreted the cardiac monitored which showed an underlying rhythm of:  nsr   Medicines ordered and prescription drug management:  I ordered medication including decadron, benadryl, toradol, reglan and morphine  for sx  Reevaluation of the patient after these medicines showed that the patient improved I have reviewed the patients home medicines and have made adjustments as needed   Test Considered:  Ct/mri   Critical Interventions:  Pain control  Problem List / ED Course:  Migraine:  pain is much better.  No structural abn on brain imaging.  Pt knows to f/u with her neurologist.  She is stable for d/c.  Return if worse.    Reevaluation:  After the interventions noted above, I reevaluated the patient and found that they have :improved   Social Determinants of Health:  Lives at home   Dispostion:  After consideration of the diagnostic results and the patients response to treatment, I feel that the patent would benefit from discharge with outpatient f/u.          Final Clinical Impression(s) / ED Diagnoses Final diagnoses:  Migraine with aura and with status migrainosus, not intractable    Rx / DC Orders ED Discharge Orders          Ordered    ondansetron (ZOFRAN-ODT) 4 MG disintegrating tablet  Every 8 hours PRN        05/02/23 1531    oxyCODONE-acetaminophen (PERCOCET/ROXICET) 5-325 MG tablet  Every 6 hours PRN        05/02/23 1531              Jacalyn Lefevre, MD 05/02/23 1531

## 2023-05-02 NOTE — ED Triage Notes (Signed)
Patient came POV c/o migraine since August 9 that has worsen on today. 8/10pain.

## 2023-05-09 DIAGNOSIS — M542 Cervicalgia: Secondary | ICD-10-CM | POA: Diagnosis not present

## 2023-05-09 DIAGNOSIS — G43009 Migraine without aura, not intractable, without status migrainosus: Secondary | ICD-10-CM | POA: Diagnosis not present

## 2023-05-10 ENCOUNTER — Ambulatory Visit
Admission: RE | Admit: 2023-05-10 | Discharge: 2023-05-10 | Disposition: A | Discharge status: Home / Self Care | Payer: BC Managed Care – PPO | Source: Ambulatory Visit | Attending: Obstetrics and Gynecology | Admitting: Obstetrics and Gynecology

## 2023-05-10 DIAGNOSIS — Z9189 Other specified personal risk factors, not elsewhere classified: Secondary | ICD-10-CM | POA: Insufficient documentation

## 2023-05-10 DIAGNOSIS — Z803 Family history of malignant neoplasm of breast: Secondary | ICD-10-CM | POA: Diagnosis not present

## 2023-05-10 DIAGNOSIS — Z1231 Encounter for screening mammogram for malignant neoplasm of breast: Secondary | ICD-10-CM | POA: Insufficient documentation

## 2023-06-04 DIAGNOSIS — M4802 Spinal stenosis, cervical region: Secondary | ICD-10-CM | POA: Diagnosis not present

## 2023-06-04 DIAGNOSIS — M50222 Other cervical disc displacement at C5-C6 level: Secondary | ICD-10-CM | POA: Diagnosis not present

## 2023-06-04 DIAGNOSIS — M50322 Other cervical disc degeneration at C5-C6 level: Secondary | ICD-10-CM | POA: Diagnosis not present

## 2023-06-13 DIAGNOSIS — G43009 Migraine without aura, not intractable, without status migrainosus: Secondary | ICD-10-CM | POA: Diagnosis not present

## 2023-06-13 DIAGNOSIS — M5412 Radiculopathy, cervical region: Secondary | ICD-10-CM | POA: Diagnosis not present

## 2023-12-12 DIAGNOSIS — G43009 Migraine without aura, not intractable, without status migrainosus: Secondary | ICD-10-CM | POA: Diagnosis not present

## 2023-12-12 DIAGNOSIS — Z1330 Encounter for screening examination for mental health and behavioral disorders, unspecified: Secondary | ICD-10-CM | POA: Diagnosis not present

## 2023-12-28 DIAGNOSIS — M545 Low back pain, unspecified: Secondary | ICD-10-CM | POA: Diagnosis not present

## 2024-01-09 DIAGNOSIS — M545 Low back pain, unspecified: Secondary | ICD-10-CM | POA: Diagnosis not present

## 2024-05-09 DIAGNOSIS — E559 Vitamin D deficiency, unspecified: Secondary | ICD-10-CM | POA: Diagnosis not present

## 2024-05-09 DIAGNOSIS — Z23 Encounter for immunization: Secondary | ICD-10-CM | POA: Diagnosis not present

## 2024-05-09 DIAGNOSIS — N951 Menopausal and female climacteric states: Secondary | ICD-10-CM | POA: Diagnosis not present

## 2024-05-09 DIAGNOSIS — Z0000 Encounter for general adult medical examination without abnormal findings: Secondary | ICD-10-CM | POA: Diagnosis not present

## 2024-05-15 DIAGNOSIS — N951 Menopausal and female climacteric states: Secondary | ICD-10-CM | POA: Diagnosis not present

## 2024-05-15 DIAGNOSIS — R61 Generalized hyperhidrosis: Secondary | ICD-10-CM | POA: Diagnosis not present

## 2024-05-23 DIAGNOSIS — Z0000 Encounter for general adult medical examination without abnormal findings: Secondary | ICD-10-CM | POA: Diagnosis not present

## 2024-05-23 DIAGNOSIS — Z131 Encounter for screening for diabetes mellitus: Secondary | ICD-10-CM | POA: Diagnosis not present

## 2024-05-23 DIAGNOSIS — E559 Vitamin D deficiency, unspecified: Secondary | ICD-10-CM | POA: Diagnosis not present

## 2024-05-23 DIAGNOSIS — Z13220 Encounter for screening for lipoid disorders: Secondary | ICD-10-CM | POA: Diagnosis not present

## 2024-05-23 DIAGNOSIS — N951 Menopausal and female climacteric states: Secondary | ICD-10-CM | POA: Diagnosis not present

## 2024-06-18 ENCOUNTER — Other Ambulatory Visit: Payer: Self-pay | Admitting: Internal Medicine

## 2024-06-18 DIAGNOSIS — Z1231 Encounter for screening mammogram for malignant neoplasm of breast: Secondary | ICD-10-CM

## 2024-07-17 ENCOUNTER — Ambulatory Visit
Admission: RE | Admit: 2024-07-17 | Discharge: 2024-07-17 | Disposition: A | Discharge status: Home / Self Care | Source: Ambulatory Visit | Attending: Internal Medicine | Admitting: Internal Medicine

## 2024-07-17 DIAGNOSIS — Z1231 Encounter for screening mammogram for malignant neoplasm of breast: Secondary | ICD-10-CM | POA: Insufficient documentation

## 2024-09-02 ENCOUNTER — Encounter (HOSPITAL_COMMUNITY): Payer: Self-pay

## 2024-09-02 ENCOUNTER — Other Ambulatory Visit: Payer: Self-pay

## 2024-09-02 ENCOUNTER — Emergency Department (HOSPITAL_COMMUNITY)

## 2024-09-02 ENCOUNTER — Emergency Department (HOSPITAL_COMMUNITY)
Admission: EM | Admit: 2024-09-02 | Discharge: 2024-09-03 | Discharge status: Against Medical Advice | Attending: Emergency Medicine | Admitting: Emergency Medicine

## 2024-09-02 DIAGNOSIS — Z5321 Procedure and treatment not carried out due to patient leaving prior to being seen by health care provider: Secondary | ICD-10-CM | POA: Diagnosis not present

## 2024-09-02 DIAGNOSIS — R079 Chest pain, unspecified: Secondary | ICD-10-CM | POA: Insufficient documentation

## 2024-09-02 LAB — BASIC METABOLIC PANEL WITH GFR
Anion gap: 10 (ref 5–15)
BUN: 14 mg/dL (ref 6–20)
CO2: 26 mmol/L (ref 22–32)
Calcium: 8.7 mg/dL — ABNORMAL LOW (ref 8.9–10.3)
Chloride: 101 mmol/L (ref 98–111)
Creatinine, Ser: 0.84 mg/dL (ref 0.44–1.00)
GFR, Estimated: >60 mL/min
Glucose, Bld: 107 mg/dL — ABNORMAL HIGH (ref 70–99)
Potassium: 3.9 mmol/L (ref 3.5–5.1)
Sodium: 137 mmol/L (ref 135–145)

## 2024-09-02 LAB — CBC
HCT: 42.5 % (ref 36.0–46.0)
Hemoglobin: 14.1 g/dL (ref 12.0–15.0)
MCH: 30.8 pg (ref 26.0–34.0)
MCHC: 33.2 g/dL (ref 30.0–36.0)
MCV: 92.8 fL (ref 80.0–100.0)
Platelets: 367 K/uL (ref 150–400)
RBC: 4.58 MIL/uL (ref 3.87–5.11)
RDW: 12.2 % (ref 11.5–15.5)
WBC: 4.9 K/uL (ref 4.0–10.5)
nRBC: 0 % (ref 0.0–0.2)

## 2024-09-02 LAB — TROPONIN T, HIGH SENSITIVITY
Troponin T High Sensitivity: <15 ng/L (ref 0–19)
Troponin T High Sensitivity: <15 ng/L (ref 0–19)

## 2024-09-02 NOTE — ED Notes (Signed)
Called pt for VS, no answer.

## 2024-09-02 NOTE — ED Triage Notes (Signed)
 Chest pain started yesterday. She states its in the central chest and it will feel like it tightens and then it goes away and comes bacl 10-15 minutes later.  She states that the pain does not radiate. She says last week she was having sharp pain shooting up her left arm but that went away and has not came back.

## 2024-09-02 NOTE — ED Provider Triage Note (Signed)
 Emergency Medicine Provider Triage Evaluation Note  GLENETTA KIGER , a 53 y.o. female  was evaluated in triage.  Pt complains of central chest pain since last night. Comes every 5 minutes or so and lasts for just a second. Describes as sharp. Non radiating. Denies any cough or SOB.  Review of Systems  Positive:  Negative:   Physical Exam  BP 120/71   Pulse 91   Temp 98.1 F (36.7 C)   Resp 17   Ht 5' 4 (1.626 m)   Wt 77.1 kg   SpO2 100%   BMI 29.18 kg/m  Gen:   Awake, no distress   Resp:  Normal effort  MSK:   Moves extremities without difficulty  Other:  Palpable distal pulses. RRR.   Medical Decision Making  Medically screening exam initiated at 4:18 PM.  Appropriate orders placed.  Sonny DELENA Carrion was informed that the remainder of the evaluation will be completed by another provider, this initial triage assessment does not replace that evaluation, and the importance of remaining in the ED until their evaluation is complete.  Cardiac workup initiated.    Bernis Ernst, PA-C 09/02/24 [id number]

## 2024-09-02 NOTE — ED Triage Notes (Signed)
 Pt arrives via POV. PT c/o chest pain since yesterday. Denies associated symptoms. Reports pain has been more frequent today. PT is AxOx4.

## 2024-09-03 NOTE — ED Notes (Signed)
 Pt name called for updated vitals, no response
